# Patient Record
Sex: Female | Born: 1970 | Race: White | Hispanic: No | Marital: Married | Smoking: Never smoker
Health system: Southern US, Community
[De-identification: ages and names within clinical notes are randomized; demographics above are authoritative.]

## PROBLEM LIST (undated history)

## (undated) DIAGNOSIS — Z973 Presence of spectacles and contact lenses: Secondary | ICD-10-CM

## (undated) DIAGNOSIS — R339 Retention of urine, unspecified: Secondary | ICD-10-CM

## (undated) DIAGNOSIS — F418 Other specified anxiety disorders: Secondary | ICD-10-CM

## (undated) DIAGNOSIS — N812 Incomplete uterovaginal prolapse: Secondary | ICD-10-CM

## (undated) DIAGNOSIS — L237 Allergic contact dermatitis due to plants, except food: Secondary | ICD-10-CM

## (undated) DIAGNOSIS — N393 Stress incontinence (female) (male): Secondary | ICD-10-CM

## (undated) DIAGNOSIS — Z8249 Family history of ischemic heart disease and other diseases of the circulatory system: Secondary | ICD-10-CM

## (undated) DIAGNOSIS — R002 Palpitations: Secondary | ICD-10-CM

## (undated) HISTORY — DX: Family history of ischemic heart disease and other diseases of the circulatory system: Z82.49

## (undated) HISTORY — PX: HAND TENDON SURGERY: SHX663

## (undated) HISTORY — PX: URETHRA SURGERY: SHX824

## (undated) HISTORY — DX: Other specified anxiety disorders: F41.8

## (undated) HISTORY — PX: BLADDER SURGERY: SHX569

## (undated) HISTORY — DX: Palpitations: R00.2

---
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
## 1999-03-23 ENCOUNTER — Ambulatory Visit (HOSPITAL_BASED_OUTPATIENT_CLINIC_OR_DEPARTMENT_OTHER): Admission: RE | Admit: 1999-03-23 | Discharge: 1999-03-23 | Payer: Self-pay | Admitting: Orthopedic Surgery

## 1999-03-23 HISTORY — PX: HAND TENDON SURGERY: SHX663

## 1999-08-28 ENCOUNTER — Encounter: Admission: RE | Admit: 1999-08-28 | Discharge: 1999-11-26 | Payer: Self-pay | Admitting: Family Medicine

## 2000-09-12 ENCOUNTER — Other Ambulatory Visit: Admission: RE | Admit: 2000-09-12 | Discharge: 2000-09-12 | Payer: Self-pay | Admitting: *Deleted

## 2002-08-16 ENCOUNTER — Other Ambulatory Visit: Admission: RE | Admit: 2002-08-16 | Discharge: 2002-08-16 | Payer: Self-pay | Admitting: Obstetrics and Gynecology

## 2002-08-27 ENCOUNTER — Encounter: Payer: Self-pay | Admitting: Emergency Medicine

## 2002-08-27 ENCOUNTER — Emergency Department (HOSPITAL_COMMUNITY): Admission: EM | Admit: 2002-08-27 | Discharge: 2002-08-27 | Payer: Self-pay | Admitting: Emergency Medicine

## 2003-01-13 ENCOUNTER — Other Ambulatory Visit: Admission: RE | Admit: 2003-01-13 | Discharge: 2003-01-13 | Payer: Self-pay | Admitting: Obstetrics & Gynecology

## 2003-04-25 ENCOUNTER — Inpatient Hospital Stay (HOSPITAL_COMMUNITY): Admission: AD | Admit: 2003-04-25 | Discharge: 2003-04-25 | Payer: Self-pay | Admitting: Obstetrics and Gynecology

## 2003-08-16 ENCOUNTER — Inpatient Hospital Stay (HOSPITAL_COMMUNITY): Admission: AD | Admit: 2003-08-16 | Discharge: 2003-08-16 | Payer: Self-pay | Admitting: Obstetrics & Gynecology

## 2003-08-17 ENCOUNTER — Inpatient Hospital Stay (HOSPITAL_COMMUNITY): Admission: AD | Admit: 2003-08-17 | Discharge: 2003-08-20 | Payer: Self-pay | Admitting: *Deleted

## 2003-09-12 ENCOUNTER — Encounter: Admission: RE | Admit: 2003-09-12 | Discharge: 2003-10-12 | Payer: Self-pay | Admitting: Obstetrics & Gynecology

## 2005-07-18 ENCOUNTER — Inpatient Hospital Stay (HOSPITAL_COMMUNITY): Admission: AD | Admit: 2005-07-18 | Discharge: 2005-07-18 | Payer: Self-pay | Admitting: Obstetrics & Gynecology

## 2005-07-26 ENCOUNTER — Inpatient Hospital Stay (HOSPITAL_COMMUNITY): Admission: AD | Admit: 2005-07-26 | Discharge: 2005-07-28 | Payer: Self-pay | Admitting: Obstetrics and Gynecology

## 2006-12-04 ENCOUNTER — Encounter: Admission: RE | Admit: 2006-12-04 | Discharge: 2006-12-04 | Payer: Self-pay | Admitting: Obstetrics & Gynecology

## 2007-04-16 DIAGNOSIS — Z8679 Personal history of other diseases of the circulatory system: Secondary | ICD-10-CM

## 2007-04-16 DIAGNOSIS — R002 Palpitations: Secondary | ICD-10-CM

## 2007-04-16 HISTORY — DX: Personal history of other diseases of the circulatory system: Z86.79

## 2007-04-16 HISTORY — DX: Palpitations: R00.2

## 2007-08-23 ENCOUNTER — Emergency Department (HOSPITAL_COMMUNITY): Admission: EM | Admit: 2007-08-23 | Discharge: 2007-08-23 | Payer: Self-pay | Admitting: Emergency Medicine

## 2007-08-26 ENCOUNTER — Encounter: Payer: Self-pay | Admitting: *Deleted

## 2007-08-26 HISTORY — PX: US ECHOCARDIOGRAPHY: HXRAD669

## 2007-09-29 ENCOUNTER — Emergency Department (HOSPITAL_BASED_OUTPATIENT_CLINIC_OR_DEPARTMENT_OTHER): Admission: EM | Admit: 2007-09-29 | Discharge: 2007-09-29 | Payer: Self-pay | Admitting: Emergency Medicine

## 2009-11-02 ENCOUNTER — Ambulatory Visit: Payer: Self-pay | Admitting: Diagnostic Radiology

## 2009-11-02 ENCOUNTER — Emergency Department (HOSPITAL_BASED_OUTPATIENT_CLINIC_OR_DEPARTMENT_OTHER): Admission: EM | Admit: 2009-11-02 | Discharge: 2009-11-02 | Payer: Self-pay | Admitting: Emergency Medicine

## 2009-11-16 ENCOUNTER — Ambulatory Visit: Payer: Self-pay | Admitting: Cardiology

## 2009-11-22 ENCOUNTER — Ambulatory Visit: Payer: Self-pay | Admitting: Cardiology

## 2009-11-24 ENCOUNTER — Encounter: Admission: RE | Admit: 2009-11-24 | Discharge: 2009-11-24 | Payer: Self-pay | Admitting: Family Medicine

## 2010-05-06 ENCOUNTER — Encounter: Payer: Self-pay | Admitting: Obstetrics & Gynecology

## 2010-05-07 ENCOUNTER — Encounter: Payer: Self-pay | Admitting: Obstetrics & Gynecology

## 2010-08-31 NOTE — Op Note (Signed)
[facility]  Patient:    Eileen Fleming                     MRN: [id number] Proc. Date: 03/23/99 Adm. Date:  [id number] Attending:  Marlowe Shores                           Operative Report  PREOPERATIVE DIAGNOSIS:  Right little finger extensor tendon laceration.  POSTOPERATIVE DIAGNOSIS:  Right little finger extensor tendon laceration.  OPERATION:  Repair little finger and extensor tendon laceration.  SURGEON:  Artist Pais. Mina Marble, M.D.  ANESTHESIA:  Bier block.  TOURNIQUET TIME:  21 minutes.  COMPLICATIONS:  No complications.  DRAINS:  No drains.  DESCRIPTION OF PROCEDURE:  The patient was taken to the operating room and after induction of adequate Bier block analgesia, the right upper extremity was prepped and draped in the usual sterile fashion.  Once this was done, the laceration on the dorsal ulnar aspect in the area of the proximal middle phalangeal joint and the little finger on the right was extended proximally for 3 cm and a  large proximal base flap was elevated.  Once the flap was elevated, the extensor mechanism was identified and there was about 75% laceration of the extensor tendon. This was repaired after irrigation and debridement of nonvital tissues, using a  6-0 running Prolene epicutaneous stitch followed by a 4-0 Mersilene horizontal mattress suture.  At th is point in time, the skin was closed with 4-0 nylon and the patient was placed in an extension splint with a sterile dressing of Xeroform, 4x4s, and placed in a Coban wrap.  The patient tolerated the procedure well and  went to the recovery room in stable fashion. DD:  03/23/99 TD:  03/25/99 Job: [id number]

## 2011-01-10 LAB — PREGNANCY, URINE: Preg Test, Ur: NEGATIVE

## 2011-01-10 LAB — URINALYSIS, ROUTINE W REFLEX MICROSCOPIC
Ketones, ur: NEGATIVE
Nitrite: NEGATIVE
Protein, ur: NEGATIVE
Urobilinogen, UA: 0.2

## 2011-01-10 LAB — DIFFERENTIAL
Basophils Absolute: 0
Basophils Relative: 0
Eosinophils Absolute: 0.3
Lymphs Abs: 2
Neutrophils Relative %: 67

## 2011-01-10 LAB — BASIC METABOLIC PANEL
BUN: 19
Chloride: 104
Creatinine, Ser: 0.9
Glucose, Bld: 95

## 2011-01-10 LAB — CBC
MCHC: 34.2
MCV: 91.3
Platelets: 154
RDW: 13.2
WBC: 8.5

## 2011-07-22 ENCOUNTER — Other Ambulatory Visit: Payer: Self-pay | Admitting: Obstetrics & Gynecology

## 2011-07-22 DIAGNOSIS — Z1231 Encounter for screening mammogram for malignant neoplasm of breast: Secondary | ICD-10-CM

## 2011-08-01 ENCOUNTER — Ambulatory Visit: Payer: Self-pay

## 2011-08-08 ENCOUNTER — Ambulatory Visit: Payer: Self-pay

## 2011-08-16 ENCOUNTER — Inpatient Hospital Stay: Admission: RE | Admit: 2011-08-16 | Payer: Self-pay | Source: Ambulatory Visit

## 2011-09-14 ENCOUNTER — Encounter: Payer: Self-pay | Admitting: *Deleted

## 2011-09-26 ENCOUNTER — Ambulatory Visit
Admission: RE | Admit: 2011-09-26 | Discharge: 2011-09-26 | Disposition: A | Discharge status: Home / Self Care | Payer: BC Managed Care – PPO | Source: Ambulatory Visit | Attending: Obstetrics & Gynecology | Admitting: Obstetrics & Gynecology

## 2011-09-26 DIAGNOSIS — Z1231 Encounter for screening mammogram for malignant neoplasm of breast: Secondary | ICD-10-CM

## 2012-05-04 ENCOUNTER — Encounter: Payer: Self-pay | Admitting: Cardiology

## 2012-05-06 ENCOUNTER — Encounter (INDEPENDENT_AMBULATORY_CARE_PROVIDER_SITE_OTHER): Payer: BC Managed Care – PPO

## 2012-05-06 ENCOUNTER — Encounter: Payer: Self-pay | Admitting: Nurse Practitioner

## 2012-05-06 ENCOUNTER — Ambulatory Visit (INDEPENDENT_AMBULATORY_CARE_PROVIDER_SITE_OTHER): Payer: BC Managed Care – PPO | Admitting: Nurse Practitioner

## 2012-05-06 ENCOUNTER — Telehealth: Payer: Self-pay | Admitting: *Deleted

## 2012-05-06 VITALS — BP 98/64 | HR 64 | Ht 63.0 in | Wt 166.2 lb

## 2012-05-06 DIAGNOSIS — R002 Palpitations: Secondary | ICD-10-CM

## 2012-05-06 NOTE — Progress Notes (Signed)
   Eileen Fleming Date of Birth: 1970-05-25 Medical Record #[id number]  History of Present Illness: Ms. Eileen Fleming is seen back today for a work in visit. She is seen for Dr. [place]. She was last seen by him in 2011. She has had past syncopal episode that was felt to be vasovagal. She has no known CAD. Has had palpitations in the past with a remote negative Holter and echocardiogram.   She comes in today. She is here alone. She has basically done well up until last Thursday/Friday. She then started having recurrent palpitations. There is no pattern. She feels nauseated and queasy with the palpitations. Not lightheaded. No chest pain. No syncope. Has not been using extra caffeine. Has even stopped her one cup of coffee per day with no change. Describes this as "butterflies" in her chest. Different from what she felt several years ago. Went to her PCP at [facility]. EKG and labs there were all normal.   Current Outpatient Prescriptions on File Prior to Visit  Medication Sig Dispense Refill  . FLUoxetine (PROZAC) 20 MG tablet Take 20 mg by mouth daily.        Allergies  Allergen Reactions  . Morphine And Related     Itching    Past Medical History  Diagnosis Date  . Palpitations 2009    with normal Holter monitor & negative echo   . Depression with anxiety   . FHx: hypertension     Past Surgical History  Procedure Date  . Hand tendon surgery     repair to right hand  . Bladder surgery     as a child  . US echocardiography 08-26-2007    EF 55-60%    History  Smoking status  . Never Smoker   Smokeless tobacco  . Not on file    History  Alcohol Use No    Family History  Problem Relation Age of Onset  . Hypertension Mother     Review of Systems: The review of systems is per the HPI.  All other systems were reviewed and are negative.  Physical Exam: BP 98/64  Pulse 64  Ht 5\' 3"  (1.6 m)  Wt 166 lb 4 oz (75.411 kg)  BMI 29.45 kg/m2  LMP 05/05/2012 Patient is very  pleasant and in no acute distress. Skin is warm and dry. Color is normal.  HEENT is unremarkable. Normocephalic/atraumatic. PERRL. Sclera are nonicteric. Neck is supple. No masses. No JVD. Lungs are clear. Cardiac exam shows a regular rate and rhythm. Abdomen is soft. Extremities are without edema. Gait and ROM are intact. No gross neurologic deficits noted.   LABORATORY DATA:  Lab Results  Component Value Date   WBC 8.5 09/29/2007   HGB 12.6 09/29/2007   HCT 36.9 09/29/2007   PLT 154 09/29/2007   GLUCOSE 95 09/29/2007   NA 138 09/29/2007   K 4.2 09/29/2007   CL 104 09/29/2007   CREATININE .9 09/29/2007   BUN 19 09/29/2007   CO2 31 09/29/2007     Assessment / Plan:  1. Palpitations - will place a 24 hour Holter. I have left her on her current regimen. Do not have a lot of BP to work with so this will be hard to give her medicines if needed. I have asked her to liberalize her salt use in the interim. Further disposition to follow.   Patient is agreeable to this plan and will call if any problems develop in the interim.

## 2012-05-06 NOTE — Patient Instructions (Addendum)
We are going to place a 24 hour Holter today  Liberalize your salt  For now, no change in your medicines but we may add medicine based on the monitor results  We will call you with the monitor results  Call the [facility] office at [phone] if you have any questions, problems or concerns.

## 2012-05-06 NOTE — Telephone Encounter (Signed)
24 hr holter moniter placed on Pt 05/06/12 TK

## 2012-05-11 ENCOUNTER — Telehealth: Payer: Self-pay | Admitting: Cardiology

## 2012-05-11 NOTE — Telephone Encounter (Signed)
Pt calling for results of heart monitor °

## 2012-05-11 NOTE — Telephone Encounter (Signed)
Patient called was told Dr.Jordan has not reviewed monitor.Dr.Jordan out of office today will show him monitor 05/12/12 and call her back.

## 2012-05-12 NOTE — Telephone Encounter (Signed)
Patient called was told Dr.Jordan reviewed 24 hr holter monitor which revealed rare pvc's and pac's.Advised to follow up with PCP.

## 2012-05-21 ENCOUNTER — Encounter: Payer: Self-pay | Admitting: Nurse Practitioner

## 2012-08-21 ENCOUNTER — Other Ambulatory Visit: Payer: Self-pay

## 2012-08-21 DIAGNOSIS — Z1231 Encounter for screening mammogram for malignant neoplasm of breast: Secondary | ICD-10-CM

## 2012-09-28 ENCOUNTER — Ambulatory Visit: Payer: BC Managed Care – PPO

## 2012-10-23 ENCOUNTER — Ambulatory Visit
Admission: RE | Admit: 2012-10-23 | Discharge: 2012-10-23 | Disposition: A | Discharge status: Home / Self Care | Payer: BC Managed Care – PPO | Source: Ambulatory Visit

## 2012-10-23 DIAGNOSIS — Z1231 Encounter for screening mammogram for malignant neoplasm of breast: Secondary | ICD-10-CM

## 2013-10-26 ENCOUNTER — Other Ambulatory Visit: Payer: Self-pay

## 2013-10-26 DIAGNOSIS — Z1231 Encounter for screening mammogram for malignant neoplasm of breast: Secondary | ICD-10-CM

## 2013-11-04 ENCOUNTER — Ambulatory Visit: Payer: BC Managed Care – PPO

## 2013-12-14 ENCOUNTER — Ambulatory Visit
Admission: RE | Admit: 2013-12-14 | Discharge: 2013-12-14 | Disposition: A | Discharge status: Home / Self Care | Payer: BC Managed Care – PPO | Source: Ambulatory Visit

## 2013-12-14 ENCOUNTER — Encounter (INDEPENDENT_AMBULATORY_CARE_PROVIDER_SITE_OTHER): Payer: Self-pay

## 2013-12-14 DIAGNOSIS — Z1231 Encounter for screening mammogram for malignant neoplasm of breast: Secondary | ICD-10-CM

## 2017-04-23 ENCOUNTER — Other Ambulatory Visit: Payer: Self-pay | Admitting: Obstetrics & Gynecology

## 2017-04-23 DIAGNOSIS — Z1231 Encounter for screening mammogram for malignant neoplasm of breast: Secondary | ICD-10-CM

## 2017-05-15 ENCOUNTER — Ambulatory Visit
Admission: RE | Admit: 2017-05-15 | Discharge: 2017-05-15 | Disposition: A | Discharge status: Home / Self Care | Payer: BLUE CROSS/BLUE SHIELD | Source: Ambulatory Visit | Attending: Obstetrics & Gynecology | Admitting: Obstetrics & Gynecology

## 2017-05-15 DIAGNOSIS — Z1231 Encounter for screening mammogram for malignant neoplasm of breast: Secondary | ICD-10-CM

## 2017-06-11 ENCOUNTER — Ambulatory Visit (INDEPENDENT_AMBULATORY_CARE_PROVIDER_SITE_OTHER): Payer: BLUE CROSS/BLUE SHIELD | Admitting: Obstetrics & Gynecology

## 2017-06-11 ENCOUNTER — Encounter: Payer: Self-pay | Admitting: Obstetrics & Gynecology

## 2017-06-11 VITALS — BP 96/64 | Ht 63.0 in | Wt 167.0 lb

## 2017-06-11 DIAGNOSIS — Z01419 Encounter for gynecological examination (general) (routine) without abnormal findings: Secondary | ICD-10-CM | POA: Diagnosis not present

## 2017-06-11 DIAGNOSIS — Z3009 Encounter for other general counseling and advice on contraception: Secondary | ICD-10-CM | POA: Diagnosis not present

## 2017-06-11 NOTE — Progress Notes (Signed)
Eileen Fleming 1970/11/26 [id number]   History:    47 y.o. G2P2L2 Married.  Works at the [employer].  Daughter turning 11 and son 23 this spring.  RP:  Established patient presenting for annual gyn exam   HPI: Menses regular every months with light flow times 3 days.  No pelvic pain.  Normal vaginal secretions.  No pain with intercourse.  Using natural method of contraception, declines alternatives.  Urine and bowel movements normal.  Breasts normal.  Health labs with family physician.  Body mass index 29.58.  Past medical history,surgical history, family history and social history were all reviewed and documented in the EPIC chart.  Gynecologic History Patient's last menstrual period was 05/31/2017. Contraception: rhythm method Last Pap: 2018. Results were: normal per patient, will obtain records at [facility]. Last mammogram: January 2019. Results were: Negative Bone Density: Never Colonoscopy: Never  Obstetric History OB History  Gravida Para Term Preterm AB Living  2 2       2   SAB TAB Ectopic Multiple Live Births               # Outcome Date GA Lbr Len/2nd Weight Sex Delivery Anes PTL Lv  2 Para     F Vag-Spont     1 Para     M Vag-Spont          ROS: A ROS was performed and pertinent positives and negatives are included in the history.  GENERAL: No fevers or chills. HEENT: No change in vision, no earache, sore throat or sinus congestion. NECK: No pain or stiffness. CARDIOVASCULAR: No chest pain or pressure. No palpitations. PULMONARY: No shortness of breath, cough or wheeze. GASTROINTESTINAL: No abdominal pain, nausea, vomiting or diarrhea, melena or bright red blood per rectum. GENITOURINARY: No urinary frequency, urgency, hesitancy or dysuria. MUSCULOSKELETAL: No joint or muscle pain, no back pain, no recent trauma. DERMATOLOGIC: No rash, no itching, no lesions. ENDOCRINE: No polyuria, polydipsia, no heat or cold intolerance. No recent change in weight. HEMATOLOGICAL:  No anemia or easy bruising or bleeding. NEUROLOGIC: No headache, seizures, numbness, tingling or weakness. PSYCHIATRIC: No depression, no loss of interest in normal activity or change in sleep pattern.     Exam:   BP 96/64   Ht 5\' 3"  (1.6 m)   Wt 167 lb (75.8 kg)   LMP 05/31/2017 Comment: no birth control   BMI 29.58 kg/m   Body mass index is 29.58 kg/m.  General appearance : Well developed well nourished female. No acute distress HEENT: Eyes: no retinal hemorrhage or exudates,  Neck supple, trachea midline, no carotid bruits, no thyroidmegaly Lungs: Clear to auscultation, no rhonchi or wheezes, or rib retractions  Heart: Regular rate and rhythm, no murmurs or gallops Breast:Examined in sitting and supine position were symmetrical in appearance, no palpable masses or tenderness,  no skin retraction, no nipple inversion, no nipple discharge, no skin discoloration, no axillary or supraclavicular lymphadenopathy Abdomen: no palpable masses or tenderness, no rebound or guarding Extremities: no edema or skin discoloration or tenderness  Pelvic: Vulva: Normal             Vagina: No gross lesions or discharge  Cervix: No gross lesions or discharge  Uterus anteverted, normal size, shape and consistency, non-tender and mobile  Adnexa  Without masses or tenderness  Anus: Normal   Assessment/Plan:  47 y.o. female for annual exam   1. Well female exam with routine gynecological exam Normal gynecologic exam.  Pap test negative  in 2018.  Will repeat Pap next year.  Breast exam normal.  Screening mammogram negative January 2019.  Health labs with family physician.  2. Encounter for other general counseling or advice on contraception Uses rhythm method for contraception.  Declines any other contraceptive method.  Eileen DelMarie-Lyne Royal Beirne MD, 8:23 AM 06/11/2017

## 2017-06-12 ENCOUNTER — Encounter: Payer: Self-pay | Admitting: Obstetrics & Gynecology

## 2017-06-12 NOTE — Patient Instructions (Signed)
1. Well female exam with routine gynecological exam Normal gynecologic exam.  Pap test negative in 2018.  Will repeat Pap next year.  Breast exam normal.  Screening mammogram negative January 2019.  Health labs with family physician.  2. Encounter for other general counseling or advice on contraception Uses rhythm method for contraception.  Declines any other contraceptive method.  Eileen Fleming, it was a pleasure seeing you today!  Health Maintenance, Female Adopting a healthy lifestyle and getting preventive care can go a long way to promote health and wellness. Talk with your health care provider about what schedule of regular examinations is right for you. This is a good chance for you to check in with your provider about disease prevention and staying healthy. In between checkups, there are plenty of things you can do on your own. Experts have done a lot of research about which lifestyle changes and preventive measures are most likely to keep you healthy. Ask your health care provider for more information. Weight and diet Eat a healthy diet  Be sure to include plenty of vegetables, fruits, low-fat dairy products, and lean protein.  Do not eat a lot of foods high in solid fats, added sugars, or salt.  Get regular exercise. This is one of the most important things you can do for your health. ? Most adults should exercise for at least 150 minutes each week. The exercise should increase your heart rate and make you sweat (moderate-intensity exercise). ? Most adults should also do strengthening exercises at least twice a week. This is in addition to the moderate-intensity exercise.  Maintain a healthy weight  Body mass index (BMI) is a measurement that can be used to identify possible weight problems. It estimates body fat based on height and weight. Your health care provider can help determine your BMI and help you achieve or maintain a healthy weight.  For females 68 years of age and older: ? A  BMI below 18.5 is considered underweight. ? A BMI of 18.5 to 24.9 is normal. ? A BMI of 25 to 29.9 is considered overweight. ? A BMI of 30 and above is considered obese.  Watch levels of cholesterol and blood lipids  You should start having your blood tested for lipids and cholesterol at 46 years of age, then have this test every 5 years.  You may need to have your cholesterol levels checked more often if: ? Your lipid or cholesterol levels are high. ? You are older than 46 years of age. ? You are at high risk for heart disease.  Cancer screening Lung Cancer  Lung cancer screening is recommended for adults 40-93 years old who are at high risk for lung cancer because of a history of smoking.  A yearly low-dose CT scan of the lungs is recommended for people who: ? Currently smoke. ? Have quit within the past 15 years. ? Have at least a 30-pack-year history of smoking. A pack year is smoking an average of one pack of cigarettes a day for 1 year.  Yearly screening should continue until it has been 15 years since you quit.  Yearly screening should stop if you develop a health problem that would prevent you from having lung cancer treatment.  Breast Cancer  Practice breast self-awareness. This means understanding how your breasts normally appear and feel.  It also means doing regular breast self-exams. Let your health care provider know about any changes, no matter how small.  If you are in your 37s or  11s, you should have a clinical breast exam (CBE) by a health care provider every 1-3 years as part of a regular health exam.  If you are 53 or older, have a CBE every year. Also consider having a breast X-ray (mammogram) every year.  If you have a family history of breast cancer, talk to your health care provider about genetic screening.  If you are at high risk for breast cancer, talk to your health care provider about having an MRI and a mammogram every year.  Breast cancer gene  (BRCA) assessment is recommended for women who have family members with BRCA-related cancers. BRCA-related cancers include: ? Breast. ? Ovarian. ? Tubal. ? Peritoneal cancers.  Results of the assessment will determine the need for genetic counseling and BRCA1 and BRCA2 testing.  Cervical Cancer Your health care provider may recommend that you be screened regularly for cancer of the pelvic organs (ovaries, uterus, and vagina). This screening involves a pelvic examination, including checking for microscopic changes to the surface of your cervix (Pap test). You may be encouraged to have this screening done every 3 years, beginning at age 4.  For women ages 28-65, health care providers may recommend pelvic exams and Pap testing every 3 years, or they may recommend the Pap and pelvic exam, combined with testing for human papilloma virus (HPV), every 5 years. Some types of HPV increase your risk of cervical cancer. Testing for HPV may also be done on women of any age with unclear Pap test results.  Other health care providers may not recommend any screening for nonpregnant women who are considered low risk for pelvic cancer and who do not have symptoms. Ask your health care provider if a screening pelvic exam is right for you.  If you have had past treatment for cervical cancer or a condition that could lead to cancer, you need Pap tests and screening for cancer for at least 20 years after your treatment. If Pap tests have been discontinued, your risk factors (such as having a new sexual partner) need to be reassessed to determine if screening should resume. Some women have medical problems that increase the chance of getting cervical cancer. In these cases, your health care provider may recommend more frequent screening and Pap tests.  Colorectal Cancer  This type of cancer can be detected and often prevented.  Routine colorectal cancer screening usually begins at 46 years of age and continues  through 46 years of age.  Your health care provider may recommend screening at an earlier age if you have risk factors for colon cancer.  Your health care provider may also recommend using home test kits to check for hidden blood in the stool.  A small camera at the end of a tube can be used to examine your colon directly (sigmoidoscopy or colonoscopy). This is done to check for the earliest forms of colorectal cancer.  Routine screening usually begins at age 42.  Direct examination of the colon should be repeated every 5-10 years through 46 years of age. However, you may need to be screened more often if early forms of precancerous polyps or small growths are found.  Skin Cancer  Check your skin from head to toe regularly.  Tell your health care provider about any new moles or changes in moles, especially if there is a change in a mole's shape or color.  Also tell your health care provider if you have a mole that is larger than the size of a pencil  eraser.  Always use sunscreen. Apply sunscreen liberally and repeatedly throughout the day.  Protect yourself by wearing long sleeves, pants, a wide-brimmed hat, and sunglasses whenever you are outside.  Heart disease, diabetes, and high blood pressure  High blood pressure causes heart disease and increases the risk of stroke. High blood pressure is more likely to develop in: ? People who have blood pressure in the high end of the normal range (130-139/85-89 mm Hg). ? People who are overweight or obese. ? People who are African American.  If you are 41-59 years of age, have your blood pressure checked every 3-5 years. If you are 46 years of age or older, have your blood pressure checked every year. You should have your blood pressure measured twice-once when you are at a hospital or clinic, and once when you are not at a hospital or clinic. Record the average of the two measurements. To check your blood pressure when you are not at a  hospital or clinic, you can use: ? An automated blood pressure machine at a pharmacy. ? A home blood pressure monitor.  If you are between 3 years and 66 years old, ask your health care provider if you should take aspirin to prevent strokes.  Have regular diabetes screenings. This involves taking a blood sample to check your fasting blood sugar level. ? If you are at a normal weight and have a low risk for diabetes, have this test once every three years after 46 years of age. ? If you are overweight and have a high risk for diabetes, consider being tested at a younger age or more often. Preventing infection Hepatitis B  If you have a higher risk for hepatitis B, you should be screened for this virus. You are considered at high risk for hepatitis B if: ? You were born in a country where hepatitis B is common. Ask your health care provider which countries are considered high risk. ? Your parents were born in a high-risk country, and you have not been immunized against hepatitis B (hepatitis B vaccine). ? You have HIV or AIDS. ? You use needles to inject street drugs. ? You live with someone who has hepatitis B. ? You have had sex with someone who has hepatitis B. ? You get hemodialysis treatment. ? You take certain medicines for conditions, including cancer, organ transplantation, and autoimmune conditions.  Hepatitis C  Blood testing is recommended for: ? Everyone born from [birth year] through 1964. ? Anyone with known risk factors for hepatitis C.  Sexually transmitted infections (STIs)  You should be screened for sexually transmitted infections (STIs) including gonorrhea and chlamydia if: ? You are sexually active and are younger than 46 years of age. ? You are older than 46 years of age and your health care provider tells you that you are at risk for this type of infection. ? Your sexual activity has changed since you were last screened and you are at an increased risk for chlamydia or  gonorrhea. Ask your health care provider if you are at risk.  If you do not have HIV, but are at risk, it may be recommended that you take a prescription medicine daily to prevent HIV infection. This is called pre-exposure prophylaxis (PrEP). You are considered at risk if: ? You are sexually active and do not regularly use condoms or know the HIV status of your partner(s). ? You take drugs by injection. ? You are sexually active with a partner who has HIV.  Talk with your health care provider about whether you are at high risk of being infected with HIV. If you choose to begin PrEP, you should first be tested for HIV. You should then be tested every 3 months for as long as you are taking PrEP. Pregnancy  If you are premenopausal and you may become pregnant, ask your health care provider about preconception counseling.  If you may become pregnant, take 400 to 800 micrograms (mcg) of folic acid every day.  If you want to prevent pregnancy, talk to your health care provider about birth control (contraception). Osteoporosis and menopause  Osteoporosis is a disease in which the bones lose minerals and strength with aging. This can result in serious bone fractures. Your risk for osteoporosis can be identified using a bone density scan.  If you are 68 years of age or older, or if you are at risk for osteoporosis and fractures, ask your health care provider if you should be screened.  Ask your health care provider whether you should take a calcium or vitamin D supplement to lower your risk for osteoporosis.  Menopause may have certain physical symptoms and risks.  Hormone replacement therapy may reduce some of these symptoms and risks. Talk to your health care provider about whether hormone replacement therapy is right for you. Follow these instructions at home:  Schedule regular health, dental, and eye exams.  Stay current with your immunizations.  Do not use any tobacco products including  cigarettes, chewing tobacco, or electronic cigarettes.  If you are pregnant, do not drink alcohol.  If you are breastfeeding, limit how much and how often you drink alcohol.  Limit alcohol intake to no more than 1 drink per day for nonpregnant women. One drink equals 12 ounces of beer, 5 ounces of wine, or 1 ounces of hard liquor.  Do not use street drugs.  Do not share needles.  Ask your health care provider for help if you need support or information about quitting drugs.  Tell your health care provider if you often feel depressed.  Tell your health care provider if you have ever been abused or do not feel safe at home. This information is not intended to replace advice given to you by your health care provider. Make sure you discuss any questions you have with your health care provider. Document Released: 10/15/2010 Document Revised: 09/07/2015 Document Reviewed: 01/03/2015 Elsevier Interactive Patient Education  [employer].

## 2018-06-11 ENCOUNTER — Ambulatory Visit (INDEPENDENT_AMBULATORY_CARE_PROVIDER_SITE_OTHER): Payer: BLUE CROSS/BLUE SHIELD | Admitting: Obstetrics & Gynecology

## 2018-06-11 ENCOUNTER — Other Ambulatory Visit: Payer: Self-pay | Admitting: Obstetrics & Gynecology

## 2018-06-11 ENCOUNTER — Encounter: Payer: Self-pay | Admitting: Obstetrics & Gynecology

## 2018-06-11 VITALS — BP 108/70 | Ht 62.5 in | Wt 164.2 lb

## 2018-06-11 DIAGNOSIS — Z01419 Encounter for gynecological examination (general) (routine) without abnormal findings: Secondary | ICD-10-CM

## 2018-06-11 DIAGNOSIS — E663 Overweight: Secondary | ICD-10-CM

## 2018-06-11 DIAGNOSIS — Z3009 Encounter for other general counseling and advice on contraception: Secondary | ICD-10-CM | POA: Diagnosis not present

## 2018-06-11 DIAGNOSIS — Z1231 Encounter for screening mammogram for malignant neoplasm of breast: Secondary | ICD-10-CM

## 2018-06-11 NOTE — Progress Notes (Signed)
Eileen Fleming July 04, 1970 [id number]   History:    48 y.o. G2P2L2 Married.  Daughter 32 yo, son 5 yo  RP:  Established patient presenting for annual gyn exam   HPI: Menstrual periods every month with normal flow.  No breakthrough bleeding.  No pelvic pain.  No pain with intercourse.  Using the natural rhythm method and declines other methods of contraception.  Breast normal.  Urine and bowel movements normal.  Body mass index stable at 29.55.  Started increasing fitness activities 3 weeks ago.  Health labs with family physician.  Past medical history,surgical history, family history and social history were all reviewed and documented in the EPIC chart.  Gynecologic History Patient's last menstrual period was 05/25/2018. Contraception: rhythm method, declines other methods Last Pap: 12/2014. Results were: Negative/HPV HR neg Last mammogram: 04/2017. Results were: Negative.  Scheduled next week Bone Density: Never Colonoscopy: 10/2017  Obstetric History OB History  Gravida Para Term Preterm AB Living  2 2       2   SAB TAB Ectopic Multiple Live Births               # Outcome Date GA Lbr Len/2nd Weight Sex Delivery Anes PTL Lv  2 Para     F Vag-Spont     1 Para     M Vag-Spont        ROS: A ROS was performed and pertinent positives and negatives are included in the history.  GENERAL: No fevers or chills. HEENT: No change in vision, no earache, sore throat or sinus congestion. NECK: No pain or stiffness. CARDIOVASCULAR: No chest pain or pressure. No palpitations. PULMONARY: No shortness of breath, cough or wheeze. GASTROINTESTINAL: No abdominal pain, nausea, vomiting or diarrhea, melena or bright red blood per rectum. GENITOURINARY: No urinary frequency, urgency, hesitancy or dysuria. MUSCULOSKELETAL: No joint or muscle pain, no back pain, no recent trauma. DERMATOLOGIC: No rash, no itching, no lesions. ENDOCRINE: No polyuria, polydipsia, no heat or cold intolerance. No recent change in  weight. HEMATOLOGICAL: No anemia or easy bruising or bleeding. NEUROLOGIC: No headache, seizures, numbness, tingling or weakness. PSYCHIATRIC: No depression, no loss of interest in normal activity or change in sleep pattern.     Exam:   BP 108/70   Ht 5' 2.5" (1.588 m)   Wt 164 lb 3.2 oz (74.5 kg)   LMP 05/25/2018   BMI 29.55 kg/m   Body mass index is 29.55 kg/m.  General appearance : Well developed well nourished female. No acute distress HEENT: Eyes: no retinal hemorrhage or exudates,  Neck supple, trachea midline, no carotid bruits, no thyroidmegaly Lungs: Clear to auscultation, no rhonchi or wheezes, or rib retractions  Heart: Regular rate and rhythm, no murmurs or gallops Breast:Examined in sitting and supine position were symmetrical in appearance, no palpable masses or tenderness,  no skin retraction, no nipple inversion, no nipple discharge, no skin discoloration, no axillary or supraclavicular lymphadenopathy Abdomen: no palpable masses or tenderness, no rebound or guarding Extremities: no edema or skin discoloration or tenderness  Pelvic: Vulva: Normal             Vagina: No gross lesions or discharge  Cervix: No gross lesions or discharge.  Pap reflex done.  Uterus  AV, normal size, shape and consistency, non-tender and mobile  Adnexa  Without masses or tenderness  Anus: Normal   Assessment/Plan:  48 y.o. female for annual exam   1. Encounter for routine gynecological examination with Papanicolaou smear of  cervix Normal gynecologic exam.  Pap reflex done today.  Breast exam normal.  Screening mammogram scheduled for next week.  Health labs with family physician.  2. Encounter for other general counseling or advice on contraception Using natural rhythm method for contraception.  Declines other types of contraception.  3. Overweight (BMI 25.0-29.9) Started exercising more for the last 3 weeks, recommend aerobic physical activities 5 times a week and weightlifting  every 2 days.  Mild decrease in calories/carbs recommended.  [name] MD, 2:42 PM 06/11/2018

## 2018-06-11 NOTE — Patient Instructions (Signed)
1. Encounter for routine gynecological examination with Papanicolaou smear of cervix Normal gynecologic exam.  Pap reflex done today.  Breast exam normal.  Screening mammogram scheduled for next week.  Health labs with family physician.  2. Encounter for other general counseling or advice on contraception Using natural rhythm method for contraception.  Declines other types of contraception.  3. Overweight (BMI 25.0-29.9) Started exercising more for the last 3 weeks, recommend aerobic physical activities 5 times a week and weightlifting every 2 days.  Mild decrease in calories/carbs recommended.  Eileen Fleming, it was a pleasure seeing you today!  I will inform you on your results as soon as they are available.

## 2018-06-11 NOTE — Addendum Note (Signed)
Addended by: Becky Sax on: 06/11/2018 03:12 PM   Modules accepted: Orders

## 2018-06-13 LAB — PAP IG W/ RFLX HPV ASCU

## 2018-06-15 ENCOUNTER — Ambulatory Visit
Admission: RE | Admit: 2018-06-15 | Discharge: 2018-06-15 | Disposition: A | Discharge status: Home / Self Care | Payer: BLUE CROSS/BLUE SHIELD | Source: Ambulatory Visit | Attending: Obstetrics & Gynecology | Admitting: Obstetrics & Gynecology

## 2018-06-15 DIAGNOSIS — Z1231 Encounter for screening mammogram for malignant neoplasm of breast: Secondary | ICD-10-CM

## 2019-02-22 ENCOUNTER — Other Ambulatory Visit: Payer: Self-pay

## 2019-02-22 DIAGNOSIS — Z20822 Contact with and (suspected) exposure to covid-19: Secondary | ICD-10-CM

## 2019-02-24 LAB — NOVEL CORONAVIRUS, NAA: SARS-CoV-2, NAA: NOT DETECTED

## 2019-05-19 ENCOUNTER — Other Ambulatory Visit: Payer: Self-pay | Admitting: Obstetrics & Gynecology

## 2019-05-19 DIAGNOSIS — Z1231 Encounter for screening mammogram for malignant neoplasm of breast: Secondary | ICD-10-CM

## 2019-06-24 ENCOUNTER — Ambulatory Visit
Admission: RE | Admit: 2019-06-24 | Discharge: 2019-06-24 | Disposition: A | Discharge status: Home / Self Care | Payer: BC Managed Care – PPO | Source: Ambulatory Visit | Attending: Obstetrics & Gynecology | Admitting: Obstetrics & Gynecology

## 2019-06-24 ENCOUNTER — Other Ambulatory Visit: Payer: Self-pay

## 2019-06-24 DIAGNOSIS — Z1231 Encounter for screening mammogram for malignant neoplasm of breast: Secondary | ICD-10-CM

## 2019-07-12 ENCOUNTER — Encounter: Payer: BLUE CROSS/BLUE SHIELD | Admitting: Obstetrics & Gynecology

## 2019-08-12 ENCOUNTER — Other Ambulatory Visit: Payer: Self-pay

## 2019-08-13 ENCOUNTER — Encounter: Payer: Self-pay | Admitting: Obstetrics & Gynecology

## 2019-10-20 ENCOUNTER — Ambulatory Visit (INDEPENDENT_AMBULATORY_CARE_PROVIDER_SITE_OTHER): Payer: BC Managed Care – PPO | Admitting: Obstetrics & Gynecology

## 2019-10-20 ENCOUNTER — Other Ambulatory Visit: Payer: Self-pay

## 2019-10-20 ENCOUNTER — Encounter: Payer: Self-pay | Admitting: Obstetrics & Gynecology

## 2019-10-20 VITALS — BP 120/78 | Ht 62.0 in | Wt 165.0 lb

## 2019-10-20 DIAGNOSIS — Z01419 Encounter for gynecological examination (general) (routine) without abnormal findings: Secondary | ICD-10-CM

## 2019-10-20 DIAGNOSIS — Z3009 Encounter for other general counseling and advice on contraception: Secondary | ICD-10-CM | POA: Diagnosis not present

## 2019-10-20 NOTE — Patient Instructions (Signed)
1. Well female exam with routine gynecological exam Normal gynecologic exam.  Pap test negative in 2020, no indication to repeat this year.  Breast exam normal.  Screening mammogram March 2021 was negative.  Body mass index 30.18, patient is in good fitness going to the gym regularly and building her muscle mass.  Health labs with family physician.  2. Encounter for other general counseling or advice on contraception Using the natural rhythm method load.  Declines contraception.  Eileen Fleming, it was a pleasure seeing you today!

## 2019-10-20 NOTE — Progress Notes (Signed)
Eileen Fleming Nov 21, 1970 [id number]   History:    49 y.o. G2P2L2 Married.  Daughter 77 yo, son 42 yo  RP:  Established patient presenting for annual gyn exam   HPI: Menstrual periods every month with normal flow.  No breakthrough bleeding.  No pelvic pain.  No pain with intercourse.  Using the natural rhythm method and declines other methods of contraception.  Breast normal.  Urine and bowel movements normal.  Body mass index 30.18.  Good muscle mass, going to the gym regularly.  Health labs with family physician.   Past medical history,surgical history, family history and social history were all reviewed and documented in the EPIC chart.  Gynecologic History No LMP recorded.  Obstetric History OB History  Gravida Para Term Preterm AB Living  2 2       2   SAB TAB Ectopic Multiple Live Births               # Outcome Date GA Lbr Len/2nd Weight Sex Delivery Anes PTL Lv  2 Para     F Vag-Spont     1 Para     M Vag-Spont        ROS: A ROS was performed and pertinent positives and negatives are included in the history.  GENERAL: No fevers or chills. HEENT: No change in vision, no earache, sore throat or sinus congestion. NECK: No pain or stiffness. CARDIOVASCULAR: No chest pain or pressure. No palpitations. PULMONARY: No shortness of breath, cough or wheeze. GASTROINTESTINAL: No abdominal pain, nausea, vomiting or diarrhea, melena or bright red blood per rectum. GENITOURINARY: No urinary frequency, urgency, hesitancy or dysuria. MUSCULOSKELETAL: No joint or muscle pain, no back pain, no recent trauma. DERMATOLOGIC: No rash, no itching, no lesions. ENDOCRINE: No polyuria, polydipsia, no heat or cold intolerance. No recent change in weight. HEMATOLOGICAL: No anemia or easy bruising or bleeding. NEUROLOGIC: No headache, seizures, numbness, tingling or weakness. PSYCHIATRIC: No depression, no loss of interest in normal activity or change in sleep pattern.     Exam:   BP 120/78 (BP  Location: Right Arm, Patient Position: Sitting, Cuff Size: Normal)   Ht 5\' 2"  (1.575 m)   Wt 165 lb (74.8 kg)   BMI 30.18 kg/m   Body mass index is 30.18 kg/m.  General appearance : Well developed well nourished female. No acute distress HEENT: Eyes: no retinal hemorrhage or exudates,  Neck supple, trachea midline, no carotid bruits, no thyroidmegaly Lungs: Clear to auscultation, no rhonchi or wheezes, or rib retractions  Heart: Regular rate and rhythm, no murmurs or gallops Breast:Examined in sitting and supine position were symmetrical in appearance, no palpable masses or tenderness,  no skin retraction, no nipple inversion, no nipple discharge, no skin discoloration, no axillary or supraclavicular lymphadenopathy Abdomen: no palpable masses or tenderness, no rebound or guarding Extremities: no edema or skin discoloration or tenderness  Pelvic: Vulva: Normal             Vagina: No gross lesions or discharge  Cervix: No gross lesions or discharge  Uterus  RV, normal size, shape and consistency, non-tender and mobile  Adnexa  Without masses or tenderness  Anus: Normal   Assessment/Plan:  49 y.o. female for annual exam   1. Well female exam with routine gynecological exam Normal gynecologic exam.  Pap test negative in 2020, no indication to repeat this year.  Breast exam normal.  Screening mammogram March 2021 was negative.  Body mass index 30.18, patient is in  good fitness going to the gym regularly and building her muscle mass.  Health labs with family physician.  2. Encounter for other general counseling or advice on contraception Using the natural rhythm method load.  Declines contraception.  [name] MD, 3:17 PM 10/20/2019

## 2020-05-25 ENCOUNTER — Other Ambulatory Visit: Payer: Self-pay | Admitting: Obstetrics & Gynecology

## 2020-05-25 DIAGNOSIS — Z1231 Encounter for screening mammogram for malignant neoplasm of breast: Secondary | ICD-10-CM

## 2020-07-14 ENCOUNTER — Ambulatory Visit: Payer: BC Managed Care – PPO

## 2020-09-13 ENCOUNTER — Encounter: Payer: Self-pay | Admitting: Obstetrics & Gynecology

## 2020-09-18 ENCOUNTER — Encounter: Payer: Self-pay | Admitting: Anesthesiology

## 2020-12-08 ENCOUNTER — Ambulatory Visit: Payer: BC Managed Care – PPO | Admitting: Obstetrics & Gynecology

## 2021-02-16 ENCOUNTER — Ambulatory Visit: Payer: BC Managed Care – PPO | Admitting: Obstetrics & Gynecology

## 2021-02-27 ENCOUNTER — Ambulatory Visit: Payer: BC Managed Care – PPO | Admitting: Obstetrics & Gynecology

## 2021-03-12 ENCOUNTER — Ambulatory Visit: Payer: BC Managed Care – PPO | Admitting: Obstetrics & Gynecology

## 2021-05-10 ENCOUNTER — Other Ambulatory Visit: Payer: Self-pay

## 2021-05-10 ENCOUNTER — Other Ambulatory Visit (HOSPITAL_COMMUNITY)
Admission: RE | Admit: 2021-05-10 | Discharge: 2021-05-10 | Disposition: A | Discharge status: Home / Self Care | Payer: BC Managed Care – PPO | Source: Ambulatory Visit | Attending: Obstetrics & Gynecology | Admitting: Obstetrics & Gynecology

## 2021-05-10 ENCOUNTER — Encounter: Payer: Self-pay | Admitting: Obstetrics & Gynecology

## 2021-05-10 ENCOUNTER — Ambulatory Visit (INDEPENDENT_AMBULATORY_CARE_PROVIDER_SITE_OTHER): Payer: BC Managed Care – PPO | Admitting: Obstetrics & Gynecology

## 2021-05-10 VITALS — BP 114/72 | HR 80 | Resp 16 | Ht 62.5 in | Wt 163.0 lb

## 2021-05-10 DIAGNOSIS — Z01419 Encounter for gynecological examination (general) (routine) without abnormal findings: Secondary | ICD-10-CM | POA: Diagnosis present

## 2021-05-10 DIAGNOSIS — Z3009 Encounter for other general counseling and advice on contraception: Secondary | ICD-10-CM

## 2021-05-10 NOTE — Progress Notes (Signed)
Eileen Fleming May 26, 1970 [id number]   History:    51 y.o. . G2P2L2 Married.  Daughter 37+ yo, son 25+ yo   RP:  Established patient presenting for annual gyn exam    HPI: Menstrual periods every month with normal flow.  No breakthrough bleeding.  No pelvic pain.  No pain with intercourse.  Using the natural rhythm method and declines other methods of contraception.  Pap 06/11/2018 Neg.  Pap reflex today.  Breast normal.  Mammo Neg 09/2020.  Urine and bowel movements normal.  Body mass index 29.34.  Good muscle mass, will start back going to the gym regularly.  Health labs with family physician.  Colono 10/2017.     Past medical history,surgical history, family history and social history were all reviewed and documented in the EPIC chart.  Gynecologic History Patient's last menstrual period was 05/01/2021 (exact date).  Obstetric History OB History  Gravida Para Term Preterm AB Living  2 2       2   SAB IAB Ectopic Multiple Live Births               # Outcome Date GA Lbr Len/2nd Weight Sex Delivery Anes PTL Lv  2 Para     F Vag-Spont     1 67     M Vag-Spont        ROS: A ROS was performed and pertinent positives and negatives are included in the history.  GENERAL: No fevers or chills. HEENT: No change in vision, no earache, sore throat or sinus congestion. NECK: No pain or stiffness. CARDIOVASCULAR: No chest pain or pressure. No palpitations. PULMONARY: No shortness of breath, cough or wheeze. GASTROINTESTINAL: No abdominal pain, nausea, vomiting or diarrhea, melena or bright red blood per rectum. GENITOURINARY: No urinary frequency, urgency, hesitancy or dysuria. MUSCULOSKELETAL: No joint or muscle pain, no back pain, no recent trauma. DERMATOLOGIC: No rash, no itching, no lesions. ENDOCRINE: No polyuria, polydipsia, no heat or cold intolerance. No recent change in weight. HEMATOLOGICAL: No anemia or easy bruising or bleeding. NEUROLOGIC: No headache, seizures, numbness, tingling or  weakness. PSYCHIATRIC: No depression, no loss of interest in normal activity or change in sleep pattern.     Exam:   BP 114/72    Pulse 80    Resp 16    Ht 5' 2.5" (1.588 m)    Wt 163 lb (73.9 kg)    LMP 05/01/2021 (Exact Date)    BMI 29.34 kg/m   Body mass index is 29.34 kg/m.  General appearance : Well developed well nourished female. No acute distress HEENT: Eyes: no retinal hemorrhage or exudates,  Neck supple, trachea midline, no carotid bruits, no thyroidmegaly Lungs: Clear to auscultation, no rhonchi or wheezes, or rib retractions  Heart: Regular rate and rhythm, no murmurs or gallops Breast:Examined in sitting and supine position were symmetrical in appearance, no palpable masses or tenderness,  no skin retraction, no nipple inversion, no nipple discharge, no skin discoloration, no axillary or supraclavicular lymphadenopathy Abdomen: no palpable masses or tenderness, no rebound or guarding Extremities: no edema or skin discoloration or tenderness  Pelvic: Vulva: Normal             Vagina: No gross lesions or discharge  Cervix: No gross lesions or discharge.  Pap reflex done.  Uterus  AV, normal size, shape and consistency, non-tender and mobile  Adnexa  Without masses or tenderness  Anus: Normal   Assessment/Plan:  51 y.o. female for annual exam  1. Encounter for routine gynecological examination with Papanicolaou smear of cervix Menstrual periods every month with normal flow.  No breakthrough bleeding.  No pelvic pain.  No pain with intercourse.  Using the natural rhythm method and declines other methods of contraception.  Pap 06/11/2018 Neg.  Pap reflex today.  Breast normal.  Mammo Neg 09/2020.  Urine and bowel movements normal.  Body mass index 29.34.  Good muscle mass, will start back going to the gym regularly.  Health labs with family physician.  [facility] 10/2017. - Cytology - PAP( [facility])  2. Encounter for other general counseling or advice on contraception   Menstrual periods every month with normal flow.  No breakthrough bleeding.  No pelvic pain.  No pain with intercourse.  Using the natural rhythm method and declines other methods of contraception.   Princess Bruins MD, 2:05 PM 05/10/2021

## 2021-05-14 LAB — CYTOLOGY - PAP: Diagnosis: NEGATIVE

## 2021-06-04 IMAGING — MG DIGITAL SCREENING BILAT W/ TOMO W/ CAD
6 of 10 series · 6 of 30 positions shown · non-contrast
Comparison: Previous exam(s).

CLINICAL DATA: Screening.

EXAM:
DIGITAL SCREENING BILATERAL MAMMOGRAM WITH TOMO AND CAD

[L MLO synth-2D (1 of 2)]
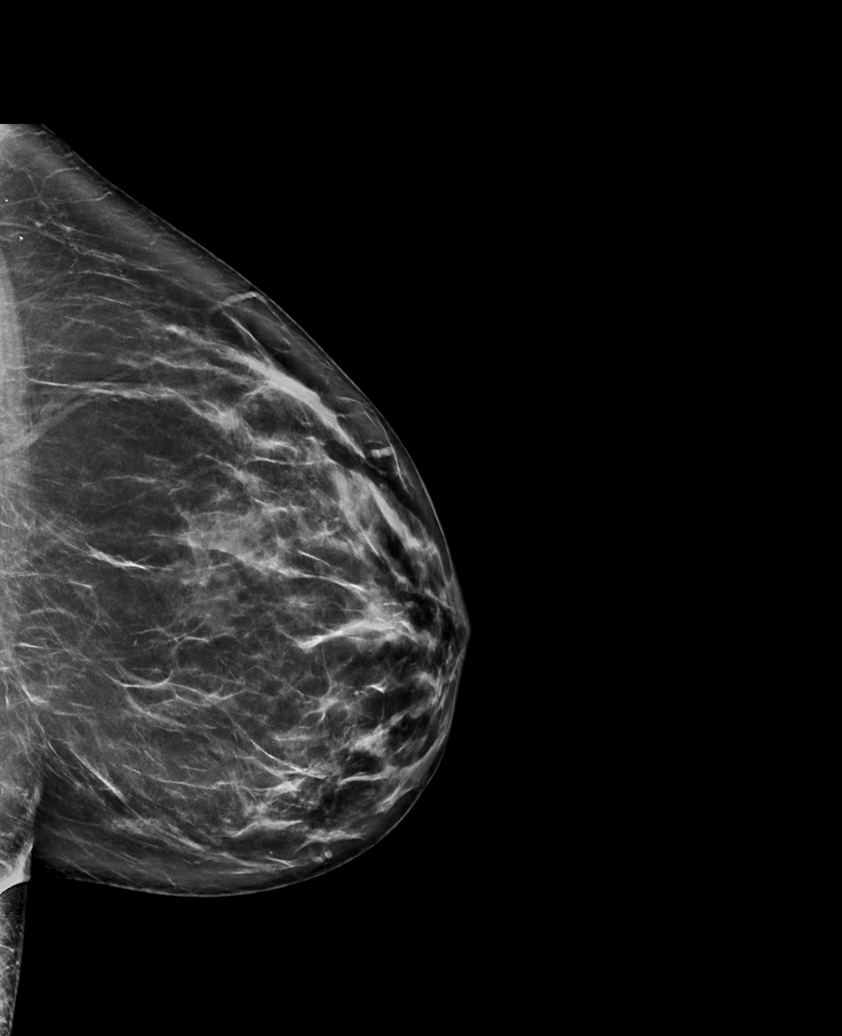

[L MLO synth-2D (2 of 2)]
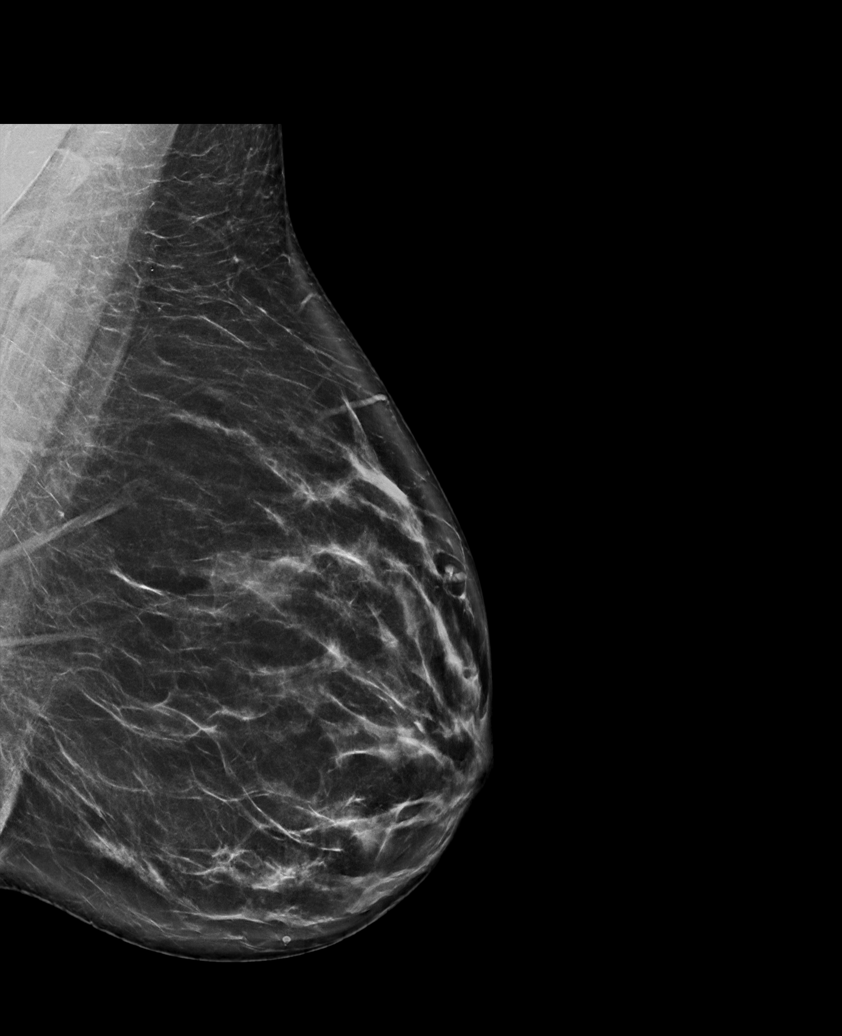

[R CC synth-2D]
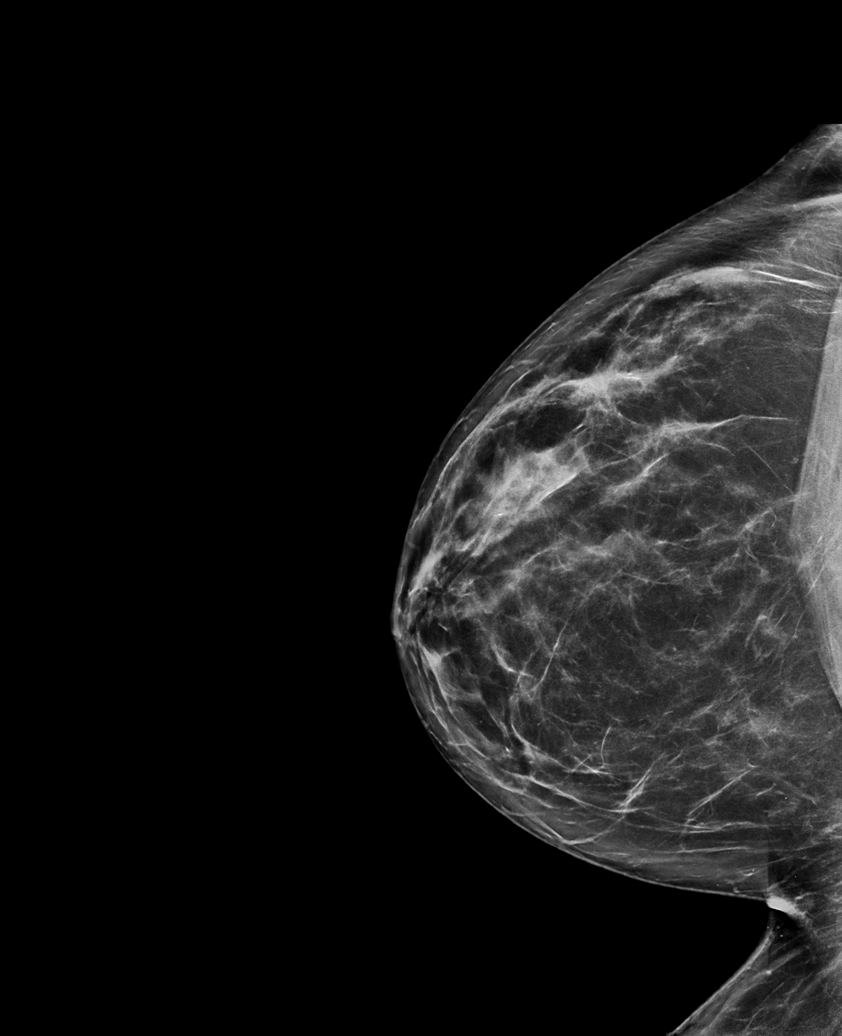

[L CC synth-2D]
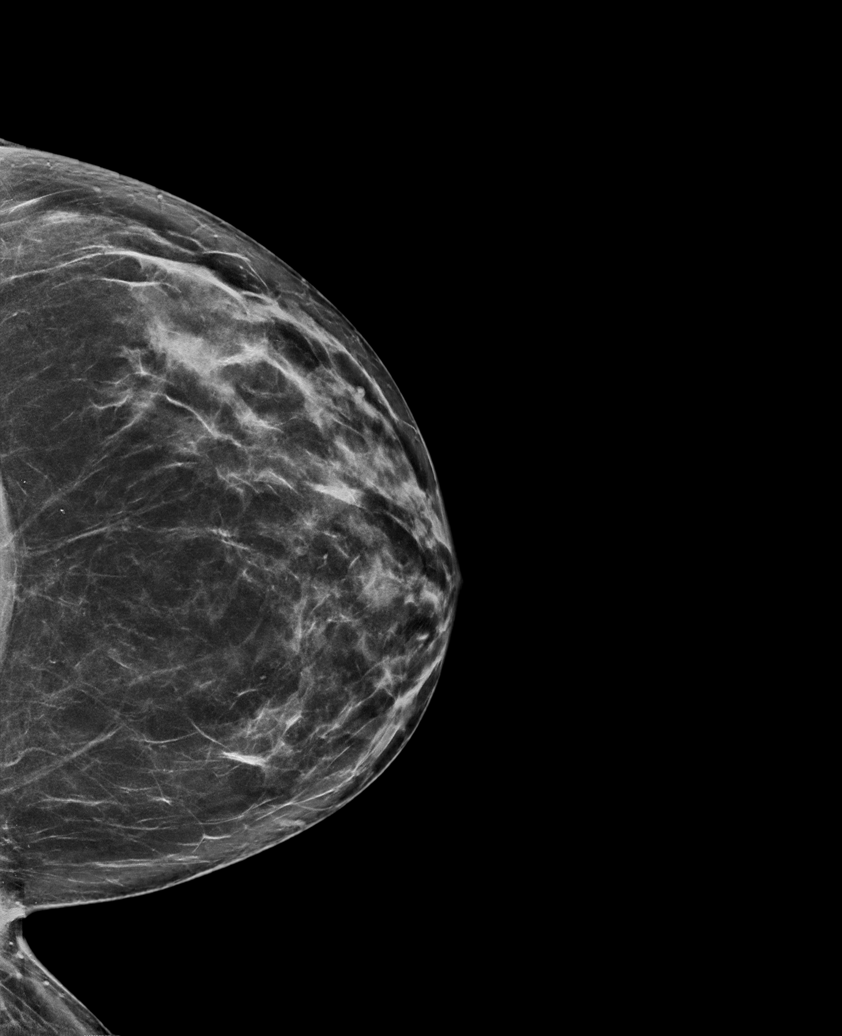

[R MLO synth-2D]
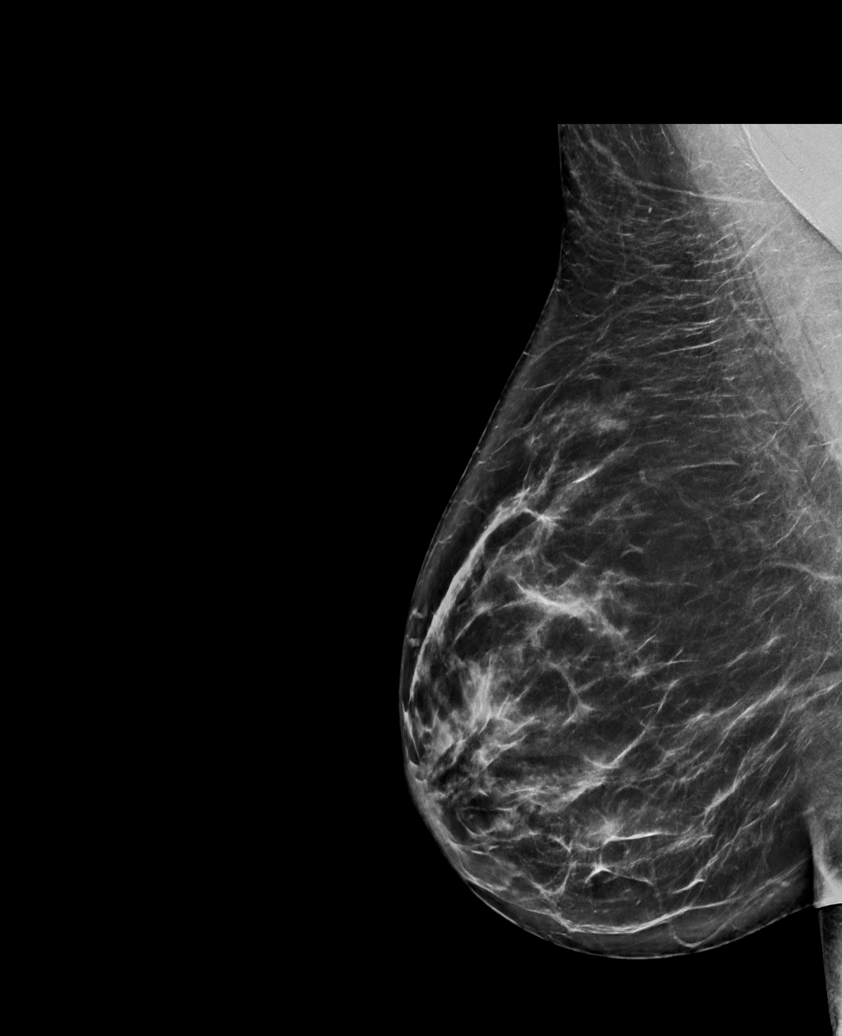

[L MLO tomo · tomo slice 45/90.0]
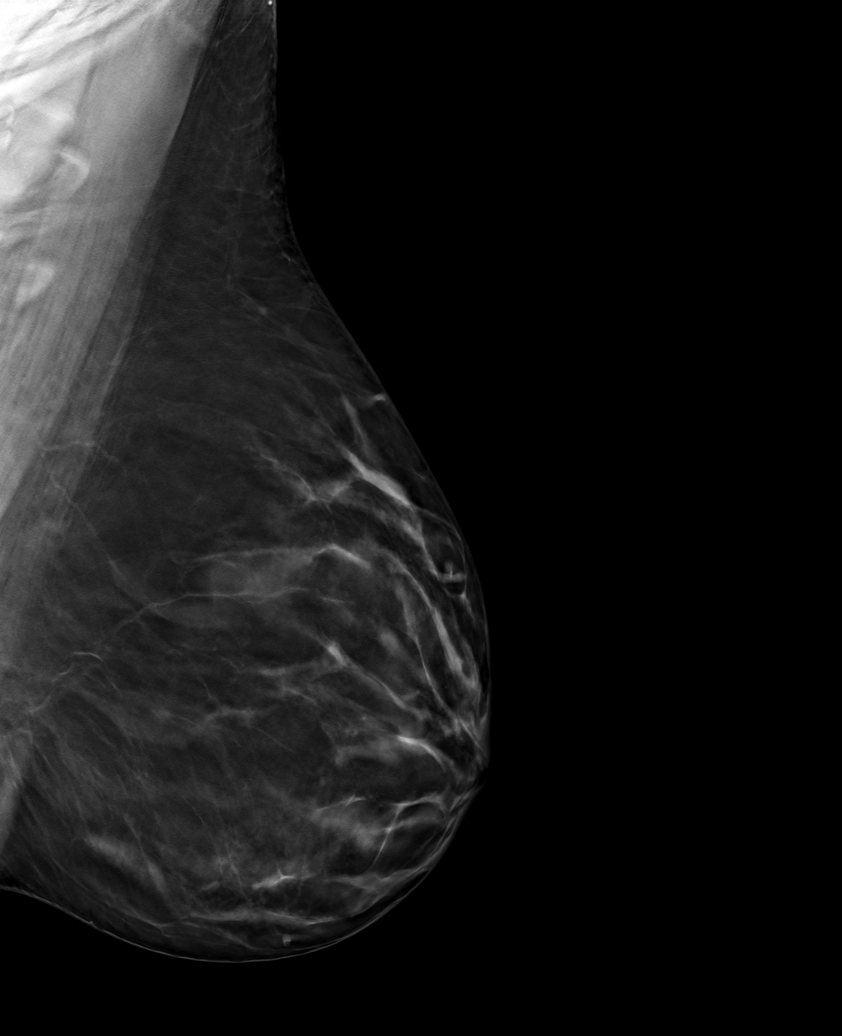

[6 of 30 positions shown; findings below may reference images not displayed]

ACR Breast Density Category b: There are scattered areas of
fibroglandular density.
FINDINGS: There are no findings suspicious for malignancy. Images were
processed with CAD.
IMPRESSION: No mammographic evidence of malignancy. A result letter of this
screening mammogram will be mailed directly to the patient.

RECOMMENDATION:
Screening mammogram in one year. (Code:[id number])

BI-RADS CATEGORY  1: Negative.

## 2021-10-02 ENCOUNTER — Encounter: Payer: Self-pay | Admitting: Obstetrics & Gynecology

## 2022-05-14 ENCOUNTER — Ambulatory Visit: Payer: BC Managed Care – PPO | Admitting: Obstetrics & Gynecology

## 2022-08-07 ENCOUNTER — Ambulatory Visit: Payer: BC Managed Care – PPO | Admitting: Obstetrics & Gynecology

## 2022-09-28 ENCOUNTER — Telehealth: Payer: Self-pay | Admitting: Obstetrics and Gynecology

## 2022-09-28 NOTE — Telephone Encounter (Signed)
Returned call from Gyn answer service ID [id number]  Patient called stating aving increased pelvic pressure and discomfort for the last few weeks and today felt a bulge while wiping, using a mirror and confirmed bulge. No bleeding or abnormal discharge. Not tried to digitally reduced. Going on vacation and wondering should she cancel due to concern for prolapse.  Advised to attempt to reduce and kegels. No need to cancel vacation unless having significant bleeding. Can use lubricant to keep bulge moisturized and have evaluated at upcoming appointment. Briefly reviewed options of PFPT and pessary.

## 2022-10-09 ENCOUNTER — Encounter: Payer: Self-pay | Admitting: Obstetrics & Gynecology

## 2022-10-16 ENCOUNTER — Ambulatory Visit (INDEPENDENT_AMBULATORY_CARE_PROVIDER_SITE_OTHER): Payer: BC Managed Care – PPO | Admitting: Obstetrics & Gynecology

## 2022-10-16 ENCOUNTER — Encounter: Payer: Self-pay | Admitting: Obstetrics & Gynecology

## 2022-10-16 VITALS — BP 112/70 | HR 100 | Ht 62.75 in | Wt 171.0 lb

## 2022-10-16 DIAGNOSIS — N814 Uterovaginal prolapse, unspecified: Secondary | ICD-10-CM | POA: Diagnosis not present

## 2022-10-16 DIAGNOSIS — Z01419 Encounter for gynecological examination (general) (routine) without abnormal findings: Secondary | ICD-10-CM

## 2022-10-16 DIAGNOSIS — Z3009 Encounter for other general counseling and advice on contraception: Secondary | ICD-10-CM

## 2022-10-16 NOTE — Progress Notes (Signed)
Eileen Fleming Nov 18, 1970 [id number]   History:    52 y.o. G2P2L2 Married.  Daughter 59 yo, son 20 yo   RP:  Established patient presenting for annual gyn exam    HPI: Menstrual periods every month with normal flow.  No breakthrough bleeding.  No pelvic pain, but pressure in the vagina and sees a round bulge flush with the vulva.  No pain with intercourse.  Using the natural rhythm method and declines other methods of contraception.  Pap 04/2021 Neg. Repeat at 3 years. Breasts normal.  Mammo Neg 09/2022.  Urine and bowel movements normal. Body mass index 30.53.  Good muscle mass, going to the gym regularly.  Health labs with family physician.  Colono 10/2017.     Past medical history,surgical history, family history and social history were all reviewed and documented in the EPIC chart.  Gynecologic History Patient's last menstrual period was 10/10/2022 (exact date).  Obstetric History OB History  Gravida Para Term Preterm AB Living  2 2       2   SAB IAB Ectopic Multiple Live Births               # Outcome Date GA Lbr Len/2nd Weight Sex Delivery Anes PTL Lv  2 Para     F Vag-Spont     1 Para     M Vag-Spont        ROS: A ROS was performed and pertinent positives and negatives are included in the history. GENERAL: No fevers or chills. HEENT: No change in vision, no earache, sore throat or sinus congestion. NECK: No pain or stiffness. CARDIOVASCULAR: No chest pain or pressure. No palpitations. PULMONARY: No shortness of breath, cough or wheeze. GASTROINTESTINAL: No abdominal pain, nausea, vomiting or diarrhea, melena or bright red blood per rectum. GENITOURINARY: No urinary frequency, urgency, hesitancy or dysuria. MUSCULOSKELETAL: No joint or muscle pain, no back pain, no recent trauma. DERMATOLOGIC: No rash, no itching, no lesions. ENDOCRINE: No polyuria, polydipsia, no heat or cold intolerance. No recent change in weight. HEMATOLOGICAL: No anemia or easy bruising or bleeding. NEUROLOGIC:  No headache, seizures, numbness, tingling or weakness. PSYCHIATRIC: No depression, no loss of interest in normal activity or change in sleep pattern.     Exam:   BP 112/70   Pulse 100   Ht 5' 2.75" (1.594 m)   Wt 171 lb (77.6 kg)   LMP 10/10/2022 (Exact Date) Comment: sexually active, no contraception  SpO2 97%   BMI 30.53 kg/m   Body mass index is 30.53 kg/m.  General appearance : Well developed well nourished female. No acute distress HEENT: Eyes: no retinal hemorrhage or exudates,  Neck supple, trachea midline, no carotid bruits, no thyroidmegaly Lungs: Clear to auscultation, no rhonchi or wheezes, or rib retractions  Heart: Regular rate and rhythm, no murmurs or gallops Breast:Examined in sitting and supine position were symmetrical in appearance, no palpable masses or tenderness,  no skin retraction, no nipple inversion, no nipple discharge, no skin discoloration, no axillary or supraclavicular lymphadenopathy Abdomen: no palpable masses or tenderness, no rebound or guarding Extremities: no edema or skin discoloration or tenderness  Pelvic: Vulva: Normal             Vagina: No gross lesions or discharge  Cervix: No gross lesions or discharge  Uterus  AV, normal size, shape and consistency, non-tender and mobile  Adnexa  Without masses or tenderness  Anus: Normal  Standing with Valsalva:  Cervix felt just at the introitus.  Elongated cervix with a Grade 1-2/3 uterine prolapse.  Cystocele grade 1/4.  No Rectocele.   Assessment/Plan:  52 y.o. female for annual exam   1. Well female exam with routine gynecological exam Menstrual periods every month with normal flow.  No breakthrough bleeding.  No pelvic pain, but pressure in the vagina and sees a round bulge flush with the vulva.  No pain with intercourse.  Using the natural rhythm method and declines other methods of contraception.  Pap 04/2021 Neg. Repeat at 3 years. Breasts normal.  Mammo Neg 09/2022. Urine and bowel  movements normal. Body mass index 30.53.  Good muscle mass, going to the gym regularly.  Health labs with family physician.  [facility]ono 10/2017.  2. Encounter for other general counseling or advice on contraception Using the natural rhythm method and declines other methods of contraception.   3. Uterine prolapse grade 1-2/3 with elongated cervix and Cystocele grade 1/4  No pelvic pain, but pressure in the vagina and sees a round bulge flush with the vulva. Standing with Valsalva:  Cervix felt just at the introitus.  Elongated cervix with a Grade 1-2/3 uterine prolapse.  Cystocele grade 1/4.  No Rectocele. Counseling on pelvic floor weakness.  Recommend avoiding pelvic floor pressure.  Kegel exercises recommended.  Avoid overfilling her bladder.  PT, pessary and surgical correction reviewed.  Will observe at this time.  [name] MD, 2:41 PM

## 2022-10-23 ENCOUNTER — Encounter: Payer: Self-pay | Admitting: Obstetrics & Gynecology

## 2022-10-23 ENCOUNTER — Ambulatory Visit (INDEPENDENT_AMBULATORY_CARE_PROVIDER_SITE_OTHER): Payer: BC Managed Care – PPO | Admitting: Obstetrics & Gynecology

## 2022-10-23 VITALS — BP 124/82 | HR 72

## 2022-10-23 DIAGNOSIS — N814 Uterovaginal prolapse, unspecified: Secondary | ICD-10-CM

## 2022-10-23 NOTE — Progress Notes (Unsigned)
    Eileen Fleming 19-May-1970 [id number]        52 y.o.  G2P2L2 married   RP: Worsening prolapse symptoms  HPI: Worsening vaginal bulging, cramping and pressure in the groin area and vagina.  Seen 10/16/22 for Annual Gyn exam, at which time she had those Sxs, but now they are worsening and not tolerable anymore.  No UTI Sx.  At the 10/16/22 visit we noted: Menstrual periods every month with normal flow.  No breakthrough bleeding.  No pelvic pain, but pressure in the vagina and sees a round bulge flush with the vulva.  No pain with intercourse.  Using the natural rhythm method and declines other methods of contraception.  Pap 04/2021 Neg.    OB History  Gravida Para Term Preterm AB Living  2 2       2   SAB IAB Ectopic Multiple Live Births               # Outcome Date GA Lbr Len/2nd Weight Sex Delivery Anes PTL Lv  2 Para     F Vag-Spont     1 Para     M Vag-Spont       Past medical history,surgical history, problem list, medications, allergies, family history and social history were all reviewed and documented in the EPIC chart.   Directed ROS with pertinent positives and negatives documented in the history of present illness/assessment and plan.  Exam:  Vitals:   10/23/22 1007  BP: 124/82  Pulse: 72  SpO2: 98%   General appearance:  Normal  Abdomen: Normal, soft, no mass felt.  Gynecologic exam: Vulva normal.  Bimanual exam with valsalva: Uterine prolapse grade 3/3 with elongated cervix and Cystocele grade 3/4.  The posterior vaginal wall is holding well.   Assessment/Plan:  52 y.o. G2P2   1. Uterine prolapse grade 3/3 with elongated cervix and Cystocele grade 3/4  Worsening vaginal bulging, cramping and pressure in the groin area and vagina.  Seen 10/16/22 for Annual Gyn exam, at which time she had those Sxs, but now they are worsening and not tolerable anymore.  No UTI Sx.  At the 10/16/22 visit we noted: Menstrual periods every month with normal flow.  No breakthrough bleeding.   No pelvic pain, but pressure in the vagina and sees a round bulge flush with the vulva.  No pain with intercourse.  Using the natural rhythm method and declines other methods of contraception.  Pap 04/2021 Neg.  On Gyn exam today: Bimanual exam with valsalva: Uterine prolapse grade 3/3 with elongated cervix and Cystocele grade 3/4.  The posterior vaginal wall is holding well. Pessary management reviewed with patient.  Patient is not interested at this time.  Will try using a "[employer] shaped" tampon until surgical correction is done.  Refer to Dr. Azucena Freed for surgical management.  [name] MD, 10:45 AM 10/23/2022

## 2022-10-24 ENCOUNTER — Encounter: Payer: Self-pay | Admitting: Obstetrics & Gynecology

## 2023-01-03 HISTORY — PX: COLONOSCOPY WITH PROPOFOL: SHX5780

## 2023-04-23 ENCOUNTER — Ambulatory Visit: Payer: BC Managed Care – PPO | Admitting: Obstetrics

## 2023-06-19 ENCOUNTER — Ambulatory Visit: Payer: BC Managed Care – PPO | Admitting: Obstetrics and Gynecology

## 2023-08-15 ENCOUNTER — Other Ambulatory Visit (HOSPITAL_COMMUNITY)
Admission: RE | Admit: 2023-08-15 | Discharge: 2023-08-15 | Disposition: A | Discharge status: Home / Self Care | Source: Other Acute Inpatient Hospital | Attending: Obstetrics and Gynecology | Admitting: Obstetrics and Gynecology

## 2023-08-15 ENCOUNTER — Ambulatory Visit (INDEPENDENT_AMBULATORY_CARE_PROVIDER_SITE_OTHER): Admitting: Obstetrics and Gynecology

## 2023-08-15 ENCOUNTER — Encounter: Payer: Self-pay | Admitting: Obstetrics and Gynecology

## 2023-08-15 VITALS — BP 135/73 | HR 72 | Ht 62.75 in | Wt 165.0 lb

## 2023-08-15 DIAGNOSIS — R319 Hematuria, unspecified: Secondary | ICD-10-CM | POA: Insufficient documentation

## 2023-08-15 DIAGNOSIS — N812 Incomplete uterovaginal prolapse: Secondary | ICD-10-CM | POA: Diagnosis not present

## 2023-08-15 DIAGNOSIS — R350 Frequency of micturition: Secondary | ICD-10-CM

## 2023-08-15 DIAGNOSIS — N393 Stress incontinence (female) (male): Secondary | ICD-10-CM

## 2023-08-15 LAB — POCT URINALYSIS DIPSTICK
Bilirubin, UA: NEGATIVE
Glucose, UA: NEGATIVE
Ketones, UA: NEGATIVE
Leukocytes, UA: NEGATIVE
Nitrite, UA: NEGATIVE
Protein, UA: NEGATIVE
Spec Grav, UA: 1.02 (ref 1.010–1.025)
Urobilinogen, UA: 0.2 U/dL
pH, UA: 6 (ref 5.0–8.0)

## 2023-08-15 LAB — URINALYSIS, ROUTINE W REFLEX MICROSCOPIC
Bacteria, UA: NONE SEEN
Bilirubin Urine: NEGATIVE
Glucose, UA: NEGATIVE mg/dL
Ketones, ur: NEGATIVE mg/dL
Leukocytes,Ua: NEGATIVE
Nitrite: NEGATIVE
Protein, ur: NEGATIVE mg/dL
Specific Gravity, Urine: 1.003 — ABNORMAL LOW (ref 1.005–1.030)
pH: 7 (ref 5.0–8.0)

## 2023-08-15 NOTE — Patient Instructions (Addendum)
You have a stage 3 (out of 4) prolapse.  We discussed the fact that it is not life threatening but there are several treatment options. For treatment of pelvic organ prolapse, we discussed options for management including expectant management, conservative management, and surgical management, such as Kegels, a pessary, pelvic floor physical therapy, and specific surgical procedures.     URODYNAMICS (UDS) TEST INFORMATION  IMPORTANT: Please try to arrive with a comfortably full bladder!    What is UDS? Urodynamics is a bladder test used to evaluate how your bladder and urethra (tube you urinate out of) work to help find out the cause of your bladder symptoms and evaluate your bladder function in order to make the best treatment plan for you.   What to expect? A nurse will perform the test and will be with you during the entire exam. First we will have to empty your bladder on a special toilet.  After you have emptied your bladder, very small catheters (plastic tubing) will be placed into your bladder and into your vagina (or rectum). These special small catheters measure pressure to help measure your bladder function.  Your bladder will be gently filled with water and you will be asked to cough and strain at several different points during the test.   You will then be asked to empty your bladder in the special toilet with the catheters in place. Most patients can urinate (pee) easily with the catheters in place since the catheters are so small. In total this procedure lasts about 45 minutes to 1 hour.  After your test is completed, you will return (or possibly be seen the same day) to review the results, talk about treatment options and make a plan moving forward.   

## 2023-08-15 NOTE — Progress Notes (Signed)
 New Patient Evaluation and Consultation  Referring Provider: Lory Rough., PA-C PCP: Lory Rough., PA-C Date of Service: 08/15/2023  SUBJECTIVE Chief Complaint: New Patient (Initial Visit) (Eileen Fleming is a 53 y.o. female here for a consult for prolapse./)  History of Present Illness: Eileen Fleming is a 53 y.o. White or Caucasian female seen in consultation at the request of PA Sherolyn Dixon for evaluation of prolapse.     Urinary Symptoms: Leaks urine with cough/ sneeze Leaks 1/ week Does not wear pads Has done pelvic PT  Day time voids 8.  Nocturia: 0 times per night to void. Voiding dysfunction:  empties bladder well.  Patient does not use a catheter to empty bladder.  When urinating, patient feels she has no difficulties Drinks: 2 cups coffee, 80-100oz water  UTIs:  0  UTI's in the last year.   Denies history of blood in urine and kidney or bladder stones  Pelvic Organ Prolapse Symptoms:                  Patient Admits to a feeling of a bulge the vaginal area. It has been present for 9- 10  months.  Patient Admits to seeing a bulge.  This bulge is bothersome. Noticing it more frequently but still comes and goes- does not depend on her acitvity.  Has not had any prior prolapse treatment.   Bowel Symptom: Bowel movements: 1 time(s) per day Stool consistency: soft  Straining: no.  Splinting: no.  Incomplete evacuation: no.  Patient Denies accidental bowel leakage / fecal incontinence Bowel regimen: none  Sexual Function Sexually active: no.  Sexual orientation:  heterosexual Pain with sex: No  Pelvic Pain Denies pelvic pain   Past Medical History:  Past Medical History:  Diagnosis Date   Depression with anxiety    FHx: hypertension    Palpitations 2009   with normal Holter monitor & negative echo      Past Surgical History:   Past Surgical History:  Procedure Laterality Date   HAND TENDON SURGERY     repair to right hand   URETHRA  SURGERY     as an infant to allow better urination   US  ECHOCARDIOGRAPHY  08/26/2007   EF 55-60%     Past OB/GYN History: OB History  Gravida Para Term Preterm AB Living  2 2    2   SAB IAB Ectopic Multiple Live Births      2    # Outcome Date GA Lbr Len/2nd Weight Sex Type Anes PTL Lv  2 Para     F Vag-Spont     1 Para     M Vag-Spont      No tears with delivery Patient's last menstrual period was 08/09/2023.     Component Value Date/Time   DIAGPAP  05/10/2021 1420    - Negative for intraepithelial lesion or malignancy (NILM)   ADEQPAP  05/10/2021 1420    Satisfactory for evaluation; transformation zone component PRESENT.    Medications: Patient currently has no medications in their medication list.   Allergies: Patient is allergic to morphine and codeine.   Social History:  Social History   Tobacco Use   Smoking status: Never   Smokeless tobacco: Never  Vaping Use   Vaping status: Never Used  Substance Use Topics   Alcohol use: Not Currently   Drug use: No    Relationship status: married Patient lives with husband and children.   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 Regular  exercise: Yes: walking History of abuse: No  Family History:   Family History  Problem Relation Age of Onset   Hypertension Mother    Glaucoma Father    Cancer Maternal Grandfather        brain - prostate     Review of Systems: Review of Systems  Constitutional:  Negative for fever, malaise/fatigue and weight loss.  Respiratory:  Negative for cough, shortness of breath and wheezing.   Cardiovascular:  Negative for chest pain, palpitations and leg swelling.  Gastrointestinal:  Negative for abdominal pain and blood in stool.  Genitourinary:  Negative for dysuria.  Musculoskeletal:  Negative for myalgias.  Skin:  Negative for rash.  Neurological:  Negative for dizziness and headaches.  Endo/Heme/Allergies:  Does not bruise/bleed easily.  Psychiatric/Behavioral:  Negative for  depression. The patient is not nervous/anxious.      OBJECTIVE Physical Exam: Vitals:   08/15/23 1259  BP: 135/73  Pulse: 72  Weight: 165 lb (74.8 kg)  Height: 5' 2.75" (1.594 m)    Physical Exam Vitals reviewed. Exam conducted with a chaperone present.  Constitutional:      General: She is not in acute distress. Pulmonary:     Effort: Pulmonary effort is normal.  Abdominal:     General: There is no distension.     Palpations: Abdomen is soft.     Tenderness: There is no abdominal tenderness. There is no rebound.  Musculoskeletal:        General: No swelling. Normal range of motion.  Skin:    General: Skin is warm and dry.     Findings: No rash.  Neurological:     Mental Status: She is alert and oriented to person, place, and time.  Psychiatric:        Mood and Affect: Mood normal.        Behavior: Behavior normal.      GU / Detailed Urogynecologic Evaluation:  Pelvic Exam: Normal external female genitalia; Bartholin's and Skene's glands normal in appearance; urethral meatus normal in appearance, no urethral masses or discharge.   CST: negative  Urethra was prepped with betadine and straight catheter placed. Urine was obtained for sample  Speculum exam reveals normal vaginal mucosa with atrophy. Cervix normal appearance. Uterus normal single, nontender. Adnexa no mass, fullness, tenderness.    Pelvic floor strength I/V, puborectalis III/V external anal sphincter IV/V  Pelvic floor musculature: Right levator non-tender, Right obturator non-tender, Left levator non-tender, Left obturator non-tender  POP-Q:   POP-Q  0                                            Aa   0                                           Ba  2                                              C   5  Gh  6                                            Pb  6.5                                            tvl   -0.5                                             Ap  -0.5                                            Bp  -4                                              D      Rectal Exam:  Normal sphincter tone, moderate distal rectocele, enterocoele not present, no rectal masses  Post-Void Residual (PVR) by Bladder Scan: In order to evaluate bladder emptying, we discussed obtaining a postvoid residual and patient agreed to this procedure.  Procedure: The ultrasound unit was placed on the patient's abdomen in the suprapubic region after the patient had voided.    Post Void Residual - 08/15/23 1311       Post Void Residual   Post Void Residual 7 mL              Laboratory Results: Lab Results  Component Value Date   COLORU Yellow 08/15/2023   CLARITYU Clear 08/15/2023   GLUCOSEUR Negative 08/15/2023   BILIRUBINUR Negative 08/15/2023   KETONESU Negative 08/15/2023   SPECGRAV 1.020 08/15/2023   RBCUR Small 08/15/2023   PHUR 6.0 08/15/2023   PROTEINUR Negative 08/15/2023   UROBILINOGEN 0.2 08/15/2023   LEUKOCYTESUR Negative 08/15/2023    Lab Results  Component Value Date   CREATININE 0.9 09/29/2007    No results found for: "HGBA1C"  Lab Results  Component Value Date   HGB 12.6 09/29/2007     ASSESSMENT AND PLAN Ms. Mohler is a 53 y.o. with:  1. Uterovaginal prolapse, incomplete   2. Urinary frequency   3. Hematuria, unspecified type   4. SUI (stress urinary incontinence, female)     Uterovaginal prolapse, incomplete Assessment & Plan: Stage II anterior, Stage II posterior, Stage III apical prolapse - For treatment of pelvic organ prolapse, we discussed options for management including expectant management, conservative management, and surgical management, such as Kegels, a pessary, pelvic floor physical therapy, and specific surgical procedures. - We discussed two options for prolapse repair:  1) vaginal repair without mesh - Pros - safer, no mesh complications - Cons - not as strong as mesh repair,  higher risk of recurrence  2) laparoscopic repair with mesh - Pros - stronger, better long-term success - Cons - risks of mesh implant (erosion into vagina or bladder, adhering to the rectum, pain) - these risks are lower than with a vaginal  mesh but still exist - Handouts provided on options for her to consider   Urinary frequency -     POCT urinalysis dipstick  Hematuria, unspecified type -     Urine Culture; Future -     Urine Microscopic; Future  SUI (stress urinary incontinence, female) Assessment & Plan: - For treatment of stress urinary incontinence,  non-surgical options include expectant management, weight loss, physical therapy, as well as a pessary.  Surgical options include a midurethral sling, Burch urethropexy, and transurethral injection of a bulking agent. - Will have her undergo urodynamic testing to assess incontinence   Return for UDS and follow up   Arma Lamp, MD

## 2023-08-15 NOTE — Assessment & Plan Note (Signed)
 Stage II anterior, Stage II posterior, Stage III apical prolapse - For treatment of pelvic organ prolapse, we discussed options for management including expectant management, conservative management, and surgical management, such as Kegels, a pessary, pelvic floor physical therapy, and specific surgical procedures. - We discussed two options for prolapse repair:  1) vaginal repair without mesh - Pros - safer, no mesh complications - Cons - not as strong as mesh repair, higher risk of recurrence  2) laparoscopic repair with mesh - Pros - stronger, better long-term success - Cons - risks of mesh implant (erosion into vagina or bladder, adhering to the rectum, pain) - these risks are lower than with a vaginal mesh but still exist - Handouts provided on options for her to consider

## 2023-08-15 NOTE — Assessment & Plan Note (Signed)
-   For treatment of stress urinary incontinence,  non-surgical options include expectant management, weight loss, physical therapy, as well as a pessary.  Surgical options include a midurethral sling, Burch urethropexy, and transurethral injection of a bulking agent. - Will have her undergo urodynamic testing to assess incontinence

## 2023-08-16 LAB — URINE CULTURE: Culture: NO GROWTH

## 2023-08-18 ENCOUNTER — Encounter: Payer: Self-pay | Admitting: Obstetrics and Gynecology

## 2023-09-24 ENCOUNTER — Ambulatory Visit (INDEPENDENT_AMBULATORY_CARE_PROVIDER_SITE_OTHER): Admitting: Obstetrics and Gynecology

## 2023-09-24 ENCOUNTER — Encounter: Payer: Self-pay | Admitting: Obstetrics and Gynecology

## 2023-09-24 VITALS — BP 107/71 | HR 73

## 2023-09-24 DIAGNOSIS — N812 Incomplete uterovaginal prolapse: Secondary | ICD-10-CM

## 2023-09-24 DIAGNOSIS — N393 Stress incontinence (female) (male): Secondary | ICD-10-CM | POA: Diagnosis not present

## 2023-09-24 DIAGNOSIS — R948 Abnormal results of function studies of other organs and systems: Secondary | ICD-10-CM | POA: Diagnosis not present

## 2023-09-24 DIAGNOSIS — R350 Frequency of micturition: Secondary | ICD-10-CM

## 2023-09-24 LAB — POCT URINALYSIS DIP (CLINITEK)
Bilirubin, UA: NEGATIVE
Glucose, UA: NEGATIVE mg/dL
Ketones, POC UA: NEGATIVE mg/dL
Leukocytes, UA: NEGATIVE
Nitrite, UA: NEGATIVE
POC PROTEIN,UA: NEGATIVE
Spec Grav, UA: 1.01 (ref 1.010–1.025)
Urobilinogen, UA: 0.2 U/dL
pH, UA: 7 (ref 5.0–8.0)

## 2023-09-24 NOTE — Progress Notes (Signed)
 [facility] Urodynamics Procedure  Referring Physician: Lory Rough., PA-C Date of Procedure: 09/24/2023  Eileen Fleming is a 53 y.o. female who presents for urodynamic evaluation. Indication(s) for study: occult SUI  Vital Signs: BP 107/71   Pulse 73   Laboratory Results: A catheterized urine specimen revealed:  POC urine:  Lab Results  Component Value Date   COLORU yellow 09/24/2023   CLARITYU clear 09/24/2023   GLUCOSEUR negative 09/24/2023   BILIRUBINUR negative 09/24/2023   KETONESU Negative 08/15/2023   SPECGRAV 1.010 09/24/2023   RBCUR trace-lysed (A) 09/24/2023   PHUR 7.0 09/24/2023   PROTEINUR NEGATIVE 08/15/2023   UROBILINOGEN 0.2 09/24/2023   LEUKOCYTESUR Negative 09/24/2023     Voiding Diary: Deferred  Procedure Timeout:  The correct patient was verified and the correct procedure was verified. The patient was in the correct position and safety precautions were reviewed based on at the patient's history.  Urodynamic Procedure A 70F dual lumen urodynamics catheter was placed under sterile conditions into the patient's bladder. A 70F catheter was placed into the rectum in order to measure abdominal pressure. EMG patches were placed in the appropriate position.  All connections were confirmed and calibrations/adjusted made. Saline was instilled into the bladder through the dual lumen catheters.  Cough/valsalva pressures were measured periodically during filling.  Patient was allowed to void.  The bladder was then emptied of its residual.  UROFLOW: Revealed a Qmax of 27 mL/sec.  She voided 135 mL and had a residual of 325 mL.  It was a interrupted pattern and represented normal habits though interpretation limited due to low voided volume.  CMG: This was performed with sterile water in the sitting position at a fill rate of 30 mL/min.    First sensation of fullness was 129 mLs,  First urge was 164 mLs,  Strong urge was 314 mLs and  Capacity was  687 mLs  Stress incontinence was demonstrated Highest positive Barrier CLPP was 165 cmH20 at 200 ml. Highest positive Barrier VLPP was 88 cmH20 at 200 ml.  Detrusor function was normal, with no phasic contractions seen.    Compliance:  Normal. End fill detrusor pressure was 0cmH20.    UPP: MUCP with barrier reduction was 52 cm of water.    MICTURITION STUDY: Voiding was performed with reduction using scopettes in the sitting position.  Pdet at Qmax was 11 cm of water.  Qmax was 27 mL/sec.  It was a interrupted pattern.  She voided 472 mL and had a residual of 215 mL.  It was a volitional void, sustained detrusor contraction was present and abdominal straining was present  EMG: This was performed with patches.  She had voluntary contractions, recruitment with fill was present and urethral sphincter was not relaxed with void.  The details of the procedure with the study tracings have been scanned into EPIC.   Urodynamic Impression:  1. Sensation was normal; capacity was normal 2. Stress Incontinence was demonstrated at normal pressures; 3. Detrusor Overactivity was not demonstrated. 4. Emptying was dysfunctional with a elevated PVR (325 with Uroflow, and 215 with Micturition study) , a sustained detrusor contraction present,  abdominal straining present, dyssynergic urethral sphincter activity on EMG.  Plan: - The patient will follow up  to discuss the findings and treatment options.

## 2023-09-24 NOTE — Patient Instructions (Signed)
Taking Care of Yourself after Urodynamics   Drink plenty of water for a day or two following your procedure. Try to have about 8 ounces (one cup) at a time, and do this 6 times or more per day unless you have fluid restrictitons AVOID irritative beverages such as coffee, tea, soda, alcoholic or citrus drinks for a day or two, as this may cause burning with urination. You may experience some discomfort or a burning sensation with urination after having this procedure. You can use over the counter Azo or pyridium to help with burning and follow the instructions on the packaging. If it does not improve within 1-2 days, or other symptoms appear (fever, chills, or difficulty urinating) call the office to speak to a nurse.  You may return to normal daily activities such as work, school, driving, exercising and housework on the day of the procedure. If your doctor gave you a prescription, take it as ordered.

## 2023-10-01 ENCOUNTER — Ambulatory Visit: Admitting: Obstetrics and Gynecology

## 2023-10-10 ENCOUNTER — Ambulatory Visit: Admitting: Obstetrics and Gynecology

## 2023-10-22 ENCOUNTER — Ambulatory Visit (INDEPENDENT_AMBULATORY_CARE_PROVIDER_SITE_OTHER): Admitting: Obstetrics and Gynecology

## 2023-10-22 ENCOUNTER — Encounter: Payer: Self-pay | Admitting: Obstetrics and Gynecology

## 2023-10-22 VITALS — BP 108/70 | HR 80

## 2023-10-22 DIAGNOSIS — R339 Retention of urine, unspecified: Secondary | ICD-10-CM

## 2023-10-22 DIAGNOSIS — N812 Incomplete uterovaginal prolapse: Secondary | ICD-10-CM | POA: Diagnosis not present

## 2023-10-22 NOTE — Progress Notes (Signed)
 [facility] Return Visit  SUBJECTIVE  History of Present Illness: Eileen Fleming is a 53 y.o. female seen in follow-up for prolapse. She underwent urodynamic testing and is interested in surgery.   Urodynamic Impression:  1. Sensation was normal; capacity was normal 2. Stress Incontinence was demonstrated at normal pressures; 3. Detrusor Overactivity was not demonstrated. 4. Emptying was dysfunctional with a elevated PVR (325 with Uroflow, and 215 with Micturition study) , a sustained detrusor contraction present,  abdominal straining present, dyssynergic urethral sphincter activity on EMG.    Past Medical History: Patient  has a past medical history of Depression with anxiety, FHx: hypertension, and Palpitations (2009).   Past Surgical History: She  has a past surgical history that includes Hand tendon surgery; Urethra surgery; and US  ECHOCARDIOGRAPHY (08/26/2007).   Medications: She currently has no medications in their medication list.   Allergies: Patient is allergic to morphine and codeine.   Social History: Patient  reports that she has never smoked. She has never used smokeless tobacco. She reports that she does not drink alcohol and does not use drugs.     OBJECTIVE     Physical Exam: Vitals:   10/22/23 1111  BP: 108/70  Pulse: 80   Gen: No apparent distress, A&O x 3.  Detailed Urogynecologic Evaluation:  Deferred. Prior exam showed:  POP-Q (08/15/23)   0                                            Aa   0                                           Ba   2                                              C    5                                            Gh   6                                            Pb   6.5                                            tvl    -0.5                                            Ap   -0.5  Bp   -4                                              D        ASSESSMENT AND  PLAN    Ms. Viger is a 53 y.o. with:  1. Uterovaginal prolapse, incomplete   2. Incomplete bladder emptying    - We discussed that although she has SUI, she also demonstrated incomplete emptying on urodynamic testing.  - Would not recommend concurrent anti-incontinence procedure. Discussed interval procedure if needed.  -She is considering surgery in January. We discussed that if she is having difficulty emptying her bladder in the meantime, then may need to consider doing the surgery sooner. Currently at baseline, she is able to void. Has not had any urinary tract infections.    Plan for surgery: Exam under anesthesia, robotic assisted total laparoscopic hysterectomy, bilateral salpingectomy, sacrocolpopexy, perineorrhaphy, cystoscopy  - We reviewed the patient's specific anatomic and functional findings, with the assistance of diagrams, and together finalized the above procedure. The planned surgical procedures were discussed along with the surgical risks outlined below, which were also provided on a detailed handout. Additional treatment options including expectant management, conservative management, medical management were discussed where appropriate.  We reviewed the benefits and risks of each treatment option.   General Surgical Risks: For all procedures, there are risks of bleeding, infection, damage to surrounding organs including but not limited to bowel, bladder, blood vessels, ureters and nerves, and need for further surgery if an injury were to occur. These risks are all low with minimally invasive surgery.   There are risks of numbness and weakness at any body site or buttock/rectal pain.  It is possible that baseline pain can be worsened by surgery, either with or without mesh. If surgery is vaginal, there is also a low risk of possible conversion to laparoscopy or open abdominal incision where indicated. Very rare risks include blood transfusion, blood clot, heart attack,  pneumonia, or death.   There is also a risk of short-term postoperative urinary retention with need to use a catheter. About half of patients need to go home from surgery with a catheter, which is then later removed in the office. The risk of long-term need for a catheter is very low. There is also a risk of worsening of overactive bladder.    Prolapse (with or without mesh): Risk factors for surgical failure  include things that put pressure on your pelvis and the surgical repair, including obesity, chronic cough, and heavy lifting or straining (including lifting children or adults, straining on the toilet, or lifting heavy objects such as furniture or anything weighing >25 lbs. Risks of recurrence is 20-30% with vaginal native tissue repair and a less than 10% with sacrocolpopexy with mesh.    Sacrocolpopexy: Mesh implants may provide more prolapse support, but do have some unique risks to consider. It is important to understand that mesh is permanent and cannot be easily removed. Risks of abdominal sacrocolpopexy mesh include mesh exposure (~3-6%), painful intercourse (recent studies show lower rates after surgery compared to before, with ~5-8% risk of new onset), and very rare risks of bowel or bladder injury or infection (<1%). The risk of mesh exposure is more likely in a woman with risks for poor healing (prior radiation, poorly controlled diabetes, or immunocompromised). The risk of new or worsened  chronic pain after mesh implant is more common in women with baseline chronic pain and/or poorly controlled anxiety or depression. There is an FDA safety notification on vaginal mesh procedures for prolapse but NOT abdominal mesh procedures and therefore does not apply to your surgery. We have extensive experience and training with mesh placement and we have close postoperative follow up to identify any potential complications from mesh.    - For preop Visit:  She is required to have a visit within 30  days of her surgery.   - Medical clearance: not required  - Anticoagulant use: No - Medicaid Hysterectomy form: No - Accepts blood transfusion: Yes - Expected length of stay: outpatient  Request sent for surgery scheduling.   [name], MD

## 2023-11-04 ENCOUNTER — Telehealth: Payer: Self-pay

## 2023-11-04 ENCOUNTER — Ambulatory Visit: Payer: Self-pay | Admitting: Obstetrics and Gynecology

## 2023-11-04 NOTE — Telephone Encounter (Signed)
 Patient called wanting the surgery scheduling to call her back. Patient states she is ready to schedule surgery but she is in the process of changing jobs and insurance. Patient would like to ask questions in regards to the insurance change and how it would work with obtaining PA of surgery.

## 2023-11-13 ENCOUNTER — Encounter (HOSPITAL_BASED_OUTPATIENT_CLINIC_OR_DEPARTMENT_OTHER): Payer: Self-pay | Admitting: *Deleted

## 2023-11-13 NOTE — Telephone Encounter (Signed)
 I sent pt a MyChart message about 9/9 date for surgery. [employer]ero

## 2023-11-26 ENCOUNTER — Encounter: Payer: Self-pay | Admitting: Obstetrics and Gynecology

## 2023-11-28 ENCOUNTER — Encounter: Payer: Self-pay | Admitting: Obstetrics and Gynecology

## 2023-11-28 NOTE — Telephone Encounter (Signed)
 Surgery scheduled and pt knows KD

## 2023-12-04 ENCOUNTER — Encounter: Payer: Self-pay | Admitting: Obstetrics and Gynecology

## 2023-12-04 ENCOUNTER — Other Ambulatory Visit: Payer: Self-pay | Admitting: Obstetrics and Gynecology

## 2023-12-04 ENCOUNTER — Encounter: Payer: Self-pay | Admitting: *Deleted

## 2023-12-04 ENCOUNTER — Ambulatory Visit (INDEPENDENT_AMBULATORY_CARE_PROVIDER_SITE_OTHER): Admitting: Obstetrics and Gynecology

## 2023-12-04 VITALS — BP 105/72 | HR 71

## 2023-12-04 DIAGNOSIS — Z01818 Encounter for other preprocedural examination: Secondary | ICD-10-CM

## 2023-12-04 DIAGNOSIS — N812 Incomplete uterovaginal prolapse: Secondary | ICD-10-CM

## 2023-12-04 MED ORDER — OXYCODONE HCL 5 MG PO TABS: 5.0000 mg | ORAL_TABLET | ORAL | 0 refills | Status: DC | PRN

## 2023-12-04 MED ORDER — ACETAMINOPHEN 500 MG PO TABS: 500.0000 mg | ORAL_TABLET | Freq: Four times a day (QID) | ORAL | 0 refills | Status: DC | PRN

## 2023-12-04 MED ORDER — POLYETHYLENE GLYCOL 3350 17 GM/SCOOP PO POWD: 17.0000 g | Freq: Every day | ORAL | 0 refills | Status: AC

## 2023-12-04 MED ORDER — ESTRADIOL 0.1 MG/GM VA CREA
0.5000 g | TOPICAL_CREAM | VAGINAL | 11 refills | Status: DC
Start: 2023-12-04 — End: 2024-02-03

## 2023-12-04 MED ORDER — HYDROXYZINE HCL 25 MG PO TABS: 25.0000 mg | ORAL_TABLET | Freq: Every evening | ORAL | 0 refills | Status: DC

## 2023-12-04 MED ORDER — IBUPROFEN 600 MG PO TABS: 600.0000 mg | ORAL_TABLET | Freq: Four times a day (QID) | ORAL | 0 refills | Status: DC | PRN

## 2023-12-04 NOTE — Progress Notes (Signed)
  Urogynecology Pre-Operative Exam  Subjective Chief Complaint: Eileen Fleming presents for a preoperative encounter.   History of Present Illness: Eileen Fleming is a 53 y.o. female who presents for preoperative visit.  She is scheduled to undergo  Exam under anesthesia, robotic assisted total laparoscopic hysterectomy, bilateral salpingectomy, sacrocolpopexy, perineorrhaphy, cystoscopy on 12/22/23.  Her symptoms include pelvic organ prolapse, and she was was found to have Stage II anterior, Stage II posterior, Stage III apical prolapse.   Urodynamics showed: 1. Sensation was normal; capacity was normal 2. Stress Incontinence was demonstrated at normal pressures; 3. Detrusor Overactivity was not demonstrated. 4. Emptying was dysfunctional with a elevated PVR (325 with Uroflow, and 215 with Micturition study) , a sustained detrusor contraction present,  abdominal straining present, dyssynergic urethral sphincter activity on EMG.  Past Medical History:  Diagnosis Date   Depression with anxiety    FHx: hypertension    Palpitations 2009   with normal Holter monitor & negative echo      Past Surgical History:  Procedure Laterality Date   HAND TENDON SURGERY     repair to right hand   URETHRA SURGERY     as an infant to allow better urination   US  ECHOCARDIOGRAPHY  08/26/2007   EF 55-60%    is allergic to morphine and codeine.   Family History  Problem Relation Age of Onset   Hypertension Mother    Glaucoma Father    Cancer Maternal Grandfather        brain - prostate   Uterine cancer Neg Hx    Bladder Cancer Neg Hx    Renal cancer Neg Hx     Social History   Tobacco Use   Smoking status: Never   Smokeless tobacco: Never  Vaping Use   Vaping status: Never Used  Substance Use Topics   Alcohol use: Never   Drug use: No     Review of Systems was negative for a full 10 system review except as noted in the History of Present Illness.  No current outpatient  medications on file.   Objective There were no vitals filed for this visit.  Gen: NAD CV: S1 S2 RRR Lungs: Clear to auscultation bilaterally Abd: soft, nontender   Previous Pelvic Exam showed: POP-Q   0                                            Aa   0                                           Ba   2                                              C    5                                            Gh   6  Pb   6.5                                            tvl    -0.5                                            Ap   -0.5                                            Bp   -4                                              D      Assessment/ Plan  Assessment: The patient is a 53 y.o. year old scheduled to undergo Exam under anesthesia, robotic assisted total laparoscopic hysterectomy, bilateral salpingectomy, sacrocolpopexy, perineorrhaphy, cystoscopy. Verbal consent was obtained for these procedures.  Plan: General Surgical Consent: The patient has previously been counseled on alternative treatments, and the decision by the patient and provider was to proceed with the procedure listed above.  For all procedures, there are risks of bleeding, infection, damage to surrounding organs including but not limited to bowel, bladder, blood vessels, ureters and nerves, and need for further surgery if an injury were to occur. These risks are all low with minimally invasive surgery.   There are risks of numbness and weakness at any body site or buttock/rectal pain.  It is possible that baseline pain can be worsened by surgery, either with or without mesh. If surgery is vaginal, there is also a low risk of possible conversion to laparoscopy or open abdominal incision where indicated. Very rare risks include blood transfusion, blood clot, heart attack, pneumonia, or death.   There is also a risk of short-term postoperative urinary retention with need  to use a catheter. About half of patients need to go home from surgery with a catheter, which is then later removed in the office. The risk of long-term need for a catheter is very low. There is also a risk of worsening of overactive bladder.   Prolapse (with or without mesh): Risk factors for surgical failure  include things that put pressure on your pelvis and the surgical repair, including obesity, chronic cough, and heavy lifting or straining (including lifting children or adults, straining on the toilet, or lifting heavy objects such as furniture or anything weighing >25 lbs. Risks of recurrence is 20-30% with vaginal native tissue repair and a less than 10% with sacrocolpopexy with mesh.    Sacrocolpopexy: Mesh implants may provide more prolapse support, but do have some unique risks to consider. It is important to understand that mesh is permanent and cannot be easily removed. Risks of abdominal sacrocolpopexy mesh include mesh exposure (~3-6%), painful intercourse (recent studies show lower rates after surgery compared to before, with ~5-8% risk of new onset), and very rare risks of bowel or bladder injury or infection (<1%). The risk of mesh exposure is more likely in a woman with risks for poor healing (prior radiation,  poorly controlled diabetes, or immunocompromised). The risk of new or worsened chronic pain after mesh implant is more common in women with baseline chronic pain and/or poorly controlled anxiety or depression. There is an FDA safety notification on vaginal mesh procedures for prolapse but NOT abdominal mesh procedures and therefore does not apply to your surgery. We have extensive experience and training with mesh placement and we have close postoperative follow up to identify any potential complications from mesh.    We discussed consent for blood products. Risks for blood transfusion include allergic reactions, other reactions that can affect different body organs and managed  accordingly, transmission of infectious diseases such as HIV or Hepatitis. However, the blood is screened. Patient consents for blood products.  Pre-operative instructions:  She was instructed to not take Aspirin/NSAIDs x 7days prior to surgery.  Antibiotic prophylaxis was ordered as indicated.  Catheter use: Patient will go home with foley if needed after post-operative voiding trial.  Post-operative instructions:  She was provided with specific post-operative instructions, including precautions and signs/symptoms for which we would recommend contacting us , in addition to daytime and after-hours contact phone numbers. This was provided on a handout.   Post-operative medications: Prescriptions for motrin , tylenol , miralax , and oxycodone  were sent to her pharmacy. Discussed using ibuprofen  and tylenol  on a schedule to limit use of narcotics.   Laboratory testing:  We will check labs: CBC and Type and Screen  Preoperative clearance:  She does not require surgical clearance.    Post-operative follow-up:  A post-operative appointment will be made for 6 weeks from the date of surgery. If she needs a post-operative nurse visit for a voiding trial, that will be set up after she leaves the hospital.    Patient will call the clinic or use MyChart should anything change or any new issues arise.   Vinny Taranto G Massiah Minjares, NP

## 2023-12-04 NOTE — H&P (Signed)
 [facility] H&P  Subjective Chief Complaint: Eileen Fleming presents for a preoperative encounter.   History of Present Illness: Eileen Fleming is a 53 y.o. female who presents for preoperative visit.  She is scheduled to undergo  Exam under anesthesia, robotic assisted total laparoscopic hysterectomy, bilateral salpingectomy, sacrocolpopexy, perineorrhaphy, cystoscopy on 12/22/23.  Her symptoms include pelvic organ prolapse, and she was was found to have Stage II anterior, Stage II posterior, Stage III apical prolapse.   Urodynamics showed: 1. Sensation was normal; capacity was normal 2. Stress Incontinence was demonstrated at normal pressures; 3. Detrusor Overactivity was not demonstrated. 4. Emptying was dysfunctional with a elevated PVR (325 with Uroflow, and 215 with Micturition study) , a sustained detrusor contraction present,  abdominal straining present, dyssynergic urethral sphincter activity on EMG.  Past Medical History:  Diagnosis Date   Depression with anxiety    FHx: hypertension    Palpitations 2009   with normal Holter monitor & negative echo      Past Surgical History:  Procedure Laterality Date   HAND TENDON SURGERY     repair to right hand   URETHRA SURGERY     as an infant to allow better urination   US  ECHOCARDIOGRAPHY  08/26/2007   EF 55-60%    is allergic to morphine and codeine.   Family History  Problem Relation Age of Onset   Hypertension Mother    Glaucoma Father    Cancer Maternal Grandfather        brain - prostate   Uterine cancer Neg Hx    Bladder Cancer Neg Hx    Renal cancer Neg Hx     Social History   Tobacco Use   Smoking status: Never   Smokeless tobacco: Never  Vaping Use   Vaping status: Never Used  Substance Use Topics   Alcohol use: Never   Drug use: No     Review of Systems was negative for a full 10 system review except as noted in the History of Present Illness.  No current facility-administered  medications for this encounter.  Current Outpatient Medications:    acetaminophen  (TYLENOL ) 500 MG tablet, Take 1 tablet (500 mg total) by mouth every 6 (six) hours as needed (pain)., Disp: 30 tablet, Rfl: 0   estradiol  (ESTRACE ) 0.1 MG/GM vaginal cream, Place 0.5 g vaginally 2 (two) times a week. Place 0.5g nightly for two weeks then twice a week after, Disp: 42.5 g, Rfl: 11   hydrOXYzine  (ATARAX ) 25 MG tablet, Take 1 tablet (25 mg total) by mouth at bedtime., Disp: 30 tablet, Rfl: 0   ibuprofen  (ADVIL ) 600 MG tablet, Take 1 tablet (600 mg total) by mouth every 6 (six) hours as needed., Disp: 30 tablet, Rfl: 0   oxyCODONE  (OXY IR/ROXICODONE ) 5 MG immediate release tablet, Take 1 tablet (5 mg total) by mouth every 4 (four) hours as needed for severe pain (pain score 7-10)., Disp: 15 tablet, Rfl: 0   polyethylene glycol powder (GLYCOLAX /MIRALAX ) 17 GM/SCOOP powder, Take 17 g by mouth daily for 1 day. Drink 17g (1 scoop) dissolved in water per day., Disp: 255 g, Rfl: 0   Objective There were no vitals filed for this visit.  Gen: NAD CV: S1 S2 RRR Lungs: Clear to auscultation bilaterally Abd: soft, nontender   Previous Pelvic Exam showed: POP-Q   0  Aa   0                                           Ba   2                                              C    5                                            Gh   6                                            Pb   6.5                                            tvl    -0.5                                            Ap   -0.5                                            Bp   -4                                              D      Assessment/ Plan  Assessment: The patient is a 53 y.o. year old scheduled to undergo Exam under anesthesia, robotic assisted total laparoscopic hysterectomy, bilateral salpingectomy, sacrocolpopexy, perineorrhaphy, cystoscopy. Verbal consent was obtained for these  procedures.

## 2023-12-04 NOTE — Telephone Encounter (Signed)
 Surgery moved to 9/9 1115am arrive 915 to [facility]. Pt informed in person. Sotero CMA

## 2023-12-10 NOTE — Telephone Encounter (Signed)
 Surgery scheduled and only one insurance noted KD

## 2023-12-16 ENCOUNTER — Encounter (HOSPITAL_COMMUNITY): Payer: Self-pay | Admitting: Obstetrics and Gynecology

## 2023-12-16 NOTE — Pre-Procedure Instructions (Signed)
 Surgical Instructions   Your procedure is scheduled on :  Tuesday,  12-23-2023. Report to [facility] Main Entrance A at 9:30  A.M., then check in with the Admitting office. Any questions or running late day of surgery: call [phone]  Questions prior to your surgery date: call [phone], Monday-Friday, 8am-4pm. If you experience any cold or flu symptoms such as cough, fever, chills, shortness of breath, etc. between now and your scheduled surgery, please notify your surgeon office.    Remember:  Do not eat any food after midnight the night before your surgery.  You may have clear liquids from midnight night before surgery until 8:30 AM.    Clear liquids allowed are:  Water Carbonated Beverages Clear Tea (no milk, honey, etc.) Black Coffee Only (NO MILK, CREAM OR POWDERED CREAMER of any kind) Sport drinks, like Gatorade.  NO clear liquids after 8:30 AM day of surgery.  This includes no water,  candy,   gum,  and  mints.    Take these medicines the morning of surgery with A SIP OF WATER :  none   May take these medicines IF NEEDED:  none    One week prior to surgery, STOP taking any Aspirin (unless otherwise instructed by your surgeon) Aleve, Naproxen, Ibuprofen , Motrin , Advil , Goody's, BC's, all herbal medications, fish oil, and non-prescription vitamins.                     Do NOT Smoke (Tobacco/Vaping) and Do Not drink alcohol for 24 hours prior to your procedure.  If you use a CPAP at night, you may bring your mask/headgear for your overnight stay.   You will be asked to remove any contacts, glasses, piercing's, hearing aid's, dentures/partials prior to surgery. Please bring cases for these items if needed.    Patients discharged the day of surgery will not be allowed to drive home, and someone needs to stay with them for 24 hours.  SURGICAL WAITING ROOM VISITATION Patients may have no more than 2 support people in the waiting area - these visitors may rotate.    Pre-op nurse will coordinate an appropriate time for 1 ADULT support person, who may not rotate, to accompany patient in pre-op.  Children under the age of 87 must have an adult with them who is not the patient and must remain in the main waiting area with an adult.  If the patient needs to stay at the hospital during part of their recovery, the visitor guidelines for inpatient rooms apply.  Please refer to the [facility] website for the visitor guidelines for any additional information.   If you received a COVID test during your pre-op visit  it is requested that you wear a mask when out in public, stay away from anyone that may not be feeling well and notify your surgeon if you develop symptoms. If you have been in contact with anyone that has tested positive in the last 10 days please notify you surgeon.      Pre-operative CHG Bathing Instructions   You can play a key role in reducing the risk of infection after surgery. Your skin needs to be as free of germs as possible. You can reduce the number of germs on your skin by washing with CHG (chlorhexidine gluconate) soap before surgery. CHG is an antiseptic soap that kills germs and continues to kill germs even after washing.   DO NOT use if you have an allergy to chlorhexidine/CHG or antibacterial soaps. If  your skin becomes reddened or irritated, stop using the CHG and notify one of our RN day of surgery.             TAKE A SHOWER THE NIGHT BEFORE SURGERY AND THE DAY OF SURGERY    Please keep in mind the following:  DO NOT shave, including legs and genital area, 48 hours prior to surgery.   You may shave your face before/day of surgery.  Place clean sheets on your bed the night before surgery Use a clean washcloth (not used since being washed) for each shower. DO NOT sleep with pet's night before surgery.  CHG Shower Instructions:  Wash your face and private area with normal soap. If you choose to wash your hair, wash first with  your normal shampoo.  After you use shampoo/soap, rinse your hair and body thoroughly to remove shampoo/soap residue.  Turn the water OFF and apply half the bottle of CHG soap to a CLEAN washcloth.  Apply CHG soap ONLY FROM YOUR NECK DOWN TO YOUR TOES (washing for 3-5 minutes)  DO NOT use CHG soap on face, private areas, open wounds, or sores.  Pay special attention to the area where your surgery is being performed.  If you are having back surgery, having someone wash your back for you may be helpful. Wait 2 minutes after CHG soap is applied, then you may rinse off the CHG soap.  Pat dry with a clean towel  Put on clean pajamas    Additional instructions for the day of surgery: DO NOT APPLY any lotions,  powder,  oils,  deodorants (may use underarm deodorant) , cologne/ perfumes or makeup Do not wear jewelry /  piercing's/  metal/  permanent jewelry must be removed prior to arrival day of surgery.  (No plastic piercing) Do not wear nail polish, gel polish, artificial nails, or any other type of covering on natural finger nails (toe nails are okay) Do not bring valuables to the hospital. [facility] is not responsible for valuables/personal belongings. Put on clean/comfortable clothes.  Please brush your teeth.  Ask your nurse before applying any prescription medications to the skin.

## 2023-12-16 NOTE — Progress Notes (Signed)
 Spoke w/ via phone for pre-op interview--- pt Lab needs dos----    upt     Lab results------ lab appt 12-22-2023 @ 0930 getting CBC/T&S COVID test -----patient states asymptomatic no test needed Arrive at ------- 0930 on 12-23-2023 NPO after MN NO Solid Food.  Clear liquids from MN until--- 0830 Pre-Surgery Ensure or G2: n/a  Med rec completed Medications to take morning of surgery ----- none Diabetic medication ----- n/a  GLP1 agonist last dose: n/a GLP1 instructions:  Patient instructed no nail polish to be worn day of surgery Patient instructed to bring photo id and insurance card day of surgery Patient aware to have Driver (ride ) / caregiver    for 24 hours after surgery - husband, Eileen Fleming Patient Special Instructions ----- will pick up soap and written instructions at lab appt Pre-Op special Instructions ----- n/a  Patient verbalized understanding of instructions that were given at this phone interview. Patient denies chest pain, sob, fever, cough at the interview.

## 2023-12-17 ENCOUNTER — Encounter: Payer: Self-pay | Admitting: Obstetrics and Gynecology

## 2023-12-22 ENCOUNTER — Encounter (HOSPITAL_COMMUNITY)
Admission: RE | Admit: 2023-12-22 | Discharge: 2023-12-22 | Disposition: A | Discharge status: Home / Self Care | Source: Ambulatory Visit | Attending: Obstetrics and Gynecology

## 2023-12-22 DIAGNOSIS — N812 Incomplete uterovaginal prolapse: Secondary | ICD-10-CM | POA: Diagnosis not present

## 2023-12-22 DIAGNOSIS — Z01812 Encounter for preprocedural laboratory examination: Secondary | ICD-10-CM | POA: Insufficient documentation

## 2023-12-22 LAB — CBC
HCT: 41.4 % (ref 36.0–46.0)
Hemoglobin: 13.5 g/dL (ref 12.0–15.0)
MCH: 30.8 pg (ref 26.0–34.0)
MCHC: 32.6 g/dL (ref 30.0–36.0)
MCV: 94.3 fL (ref 80.0–100.0)
Platelets: 214 K/uL (ref 150–400)
RBC: 4.39 MIL/uL (ref 3.87–5.11)
RDW: 12.5 % (ref 11.5–15.5)
WBC: 6.5 K/uL (ref 4.0–10.5)
nRBC: 0 % (ref 0.0–0.2)

## 2023-12-22 LAB — TYPE AND SCREEN
ABO/RH(D): O POS
Antibody Screen: NEGATIVE

## 2023-12-23 ENCOUNTER — Ambulatory Visit (HOSPITAL_COMMUNITY)
Admission: RE | Admit: 2023-12-23 | Discharge: 2023-12-23 | Disposition: A | Discharge status: Home / Self Care | Attending: Obstetrics and Gynecology | Admitting: Obstetrics and Gynecology

## 2023-12-23 ENCOUNTER — Other Ambulatory Visit: Payer: Self-pay

## 2023-12-23 ENCOUNTER — Encounter (HOSPITAL_COMMUNITY): Payer: Self-pay | Admitting: Obstetrics and Gynecology

## 2023-12-23 ENCOUNTER — Ambulatory Visit (HOSPITAL_COMMUNITY): Admitting: Anesthesiology

## 2023-12-23 ENCOUNTER — Encounter (HOSPITAL_COMMUNITY)
Admission: RE | Disposition: A | Discharge status: Home / Self Care | Payer: Self-pay | Source: Home / Self Care | Attending: Obstetrics and Gynecology

## 2023-12-23 DIAGNOSIS — N812 Incomplete uterovaginal prolapse: Secondary | ICD-10-CM | POA: Diagnosis present

## 2023-12-23 DIAGNOSIS — Z79818 Long term (current) use of other agents affecting estrogen receptors and estrogen levels: Secondary | ICD-10-CM | POA: Insufficient documentation

## 2023-12-23 DIAGNOSIS — F32A Depression, unspecified: Secondary | ICD-10-CM | POA: Insufficient documentation

## 2023-12-23 DIAGNOSIS — N393 Stress incontinence (female) (male): Secondary | ICD-10-CM | POA: Diagnosis not present

## 2023-12-23 DIAGNOSIS — F419 Anxiety disorder, unspecified: Secondary | ICD-10-CM | POA: Insufficient documentation

## 2023-12-23 DIAGNOSIS — Z01818 Encounter for other preprocedural examination: Secondary | ICD-10-CM

## 2023-12-23 DIAGNOSIS — N838 Other noninflammatory disorders of ovary, fallopian tube and broad ligament: Secondary | ICD-10-CM | POA: Insufficient documentation

## 2023-12-23 HISTORY — DX: Incomplete uterovaginal prolapse: N81.2

## 2023-12-23 HISTORY — DX: Presence of spectacles and contact lenses: Z97.3

## 2023-12-23 HISTORY — DX: Stress incontinence (female) (male): N39.3

## 2023-12-23 HISTORY — PX: XI ROBOTIC ASSISTED TOTAL HYSTERECTOMY WITH SACROCOLPOPEXY: SHX6825

## 2023-12-23 HISTORY — DX: Retention of urine, unspecified: R33.9

## 2023-12-23 HISTORY — PX: PERINEOPLASTY: SHX2218

## 2023-12-23 HISTORY — DX: Allergic contact dermatitis due to plants, except food: L23.7

## 2023-12-23 HISTORY — PX: CYSTOSCOPY: SHX5120

## 2023-12-23 LAB — POCT PREGNANCY, URINE: Preg Test, Ur: NEGATIVE

## 2023-12-23 LAB — ABO/RH: ABO/RH(D): O POS

## 2023-12-23 SURGERY — HYSTERECTOMY, TOTAL, ROBOT-ASSISTED, WITH SACROCOLPOPEXY
Anesthesia: General | Site: Perineum

## 2023-12-23 MED ORDER — LIDOCAINE 2% (20 MG/ML) 5 ML SYRINGE
INTRAMUSCULAR | Status: DC | PRN
Start: 2023-12-23 — End: 2023-12-23
  Administered 2023-12-23: 60 mg via INTRAVENOUS

## 2023-12-23 MED ORDER — SODIUM CHLORIDE 0.9 % IV SOLN: INTRAVENOUS | Status: DC | PRN

## 2023-12-23 MED ORDER — ONDANSETRON HCL 4 MG/2ML IJ SOLN
INTRAMUSCULAR | Status: AC
  Filled 2023-12-23: qty 2

## 2023-12-23 MED ORDER — METRONIDAZOLE 500 MG/100ML IV SOLN
INTRAVENOUS | Status: AC
  Filled 2023-12-23: qty 100

## 2023-12-23 MED ORDER — SUGAMMADEX SODIUM 200 MG/2ML IV SOLN
INTRAVENOUS | Status: DC | PRN
Start: 2023-12-23 — End: 2023-12-23
  Administered 2023-12-23: 145.2 mg via INTRAVENOUS

## 2023-12-23 MED ORDER — LIDOCAINE-EPINEPHRINE 1 %-1:100000 IJ SOLN
INTRAMUSCULAR | Status: AC
Start: 2023-12-23 — End: 2023-12-23
  Filled 2023-12-23: qty 1

## 2023-12-23 MED ORDER — HYDROMORPHONE HCL 1 MG/ML IJ SOLN
INTRAMUSCULAR | Status: AC
  Filled 2023-12-23: qty 1

## 2023-12-23 MED ORDER — DEXAMETHASONE SODIUM PHOSPHATE 10 MG/ML IJ SOLN
INTRAMUSCULAR | Status: AC
Start: 2023-12-23 — End: 2023-12-23
  Filled 2023-12-23: qty 1

## 2023-12-23 MED ORDER — PHENAZOPYRIDINE HCL 100 MG PO TABS
ORAL_TABLET | ORAL | Status: AC
  Filled 2023-12-23: qty 2

## 2023-12-23 MED ORDER — SCOPOLAMINE 1 MG/3DAYS TD PT72
1.0000 | MEDICATED_PATCH | TRANSDERMAL | Status: DC
  Administered 2023-12-23: 1 mg via TRANSDERMAL

## 2023-12-23 MED ORDER — SCOPOLAMINE 1 MG/3DAYS TD PT72
MEDICATED_PATCH | TRANSDERMAL | Status: AC
  Filled 2023-12-23: qty 1

## 2023-12-23 MED ORDER — CEFAZOLIN SODIUM-DEXTROSE 2-4 GM/100ML-% IV SOLN
INTRAVENOUS | Status: AC
  Filled 2023-12-23: qty 100

## 2023-12-23 MED ORDER — PHENYLEPHRINE 80 MCG/ML (10ML) SYRINGE FOR IV PUSH (FOR BLOOD PRESSURE SUPPORT)
PREFILLED_SYRINGE | INTRAVENOUS | Status: DC | PRN
  Administered 2023-12-23: 80 ug via INTRAVENOUS

## 2023-12-23 MED ORDER — PROPOFOL 10 MG/ML IV BOLUS
INTRAVENOUS | Status: AC
  Filled 2023-12-23: qty 20

## 2023-12-23 MED ORDER — ACETAMINOPHEN 500 MG PO TABS
ORAL_TABLET | ORAL | Status: AC
  Filled 2023-12-23: qty 2

## 2023-12-23 MED ORDER — GABAPENTIN 300 MG PO CAPS
300.0000 mg | ORAL_CAPSULE | ORAL | Status: AC
  Administered 2023-12-23: 300 mg via ORAL

## 2023-12-23 MED ORDER — HYDROMORPHONE HCL 1 MG/ML IJ SOLN
0.2500 mg | INTRAMUSCULAR | Status: DC | PRN
  Administered 2023-12-23 (×2): 0.5 mg via INTRAVENOUS

## 2023-12-23 MED ORDER — CEFAZOLIN SODIUM-DEXTROSE 2-4 GM/100ML-% IV SOLN
2.0000 g | INTRAVENOUS | Status: AC
  Administered 2023-12-23: 2 g via INTRAVENOUS

## 2023-12-23 MED ORDER — METRONIDAZOLE 500 MG/100ML IV SOLN
500.0000 mg | Freq: Once | INTRAVENOUS | Status: AC
  Administered 2023-12-23: 500 mg via INTRAVENOUS

## 2023-12-23 MED ORDER — MIDAZOLAM HCL 2 MG/2ML IJ SOLN
INTRAMUSCULAR | Status: DC | PRN
  Administered 2023-12-23: 2 mg via INTRAVENOUS

## 2023-12-23 MED ORDER — CHLORHEXIDINE GLUCONATE 0.12 % MT SOLN
15.0000 mL | Freq: Once | OROMUCOSAL | Status: AC
  Administered 2023-12-23: 15 mL via OROMUCOSAL

## 2023-12-23 MED ORDER — ROCURONIUM BROMIDE 10 MG/ML (PF) SYRINGE
PREFILLED_SYRINGE | INTRAVENOUS | Status: AC
  Filled 2023-12-23: qty 10

## 2023-12-23 MED ORDER — SODIUM CHLORIDE 0.9 % IV SOLN: 12.5000 mg | INTRAVENOUS | Status: DC | PRN

## 2023-12-23 MED ORDER — ROCURONIUM BROMIDE 10 MG/ML (PF) SYRINGE
PREFILLED_SYRINGE | INTRAVENOUS | Status: DC | PRN
Start: 2023-12-23 — End: 2023-12-23
  Administered 2023-12-23: 70 mg via INTRAVENOUS
  Administered 2023-12-23: 20 mg via INTRAVENOUS
  Administered 2023-12-23: 30 mg via INTRAVENOUS

## 2023-12-23 MED ORDER — CHLORHEXIDINE GLUCONATE 0.12 % MT SOLN
OROMUCOSAL | Status: AC
  Filled 2023-12-23: qty 15

## 2023-12-23 MED ORDER — ORAL CARE MOUTH RINSE: 15.0000 mL | Freq: Once | OROMUCOSAL | Status: AC

## 2023-12-23 MED ORDER — FENTANYL CITRATE (PF) 250 MCG/5ML IJ SOLN
INTRAMUSCULAR | Status: DC | PRN
  Administered 2023-12-23: 50 ug via INTRAVENOUS
  Administered 2023-12-23: 100 ug via INTRAVENOUS
  Administered 2023-12-23: 50 ug via INTRAVENOUS

## 2023-12-23 MED ORDER — MEPERIDINE HCL 25 MG/ML IJ SOLN: 6.2500 mg | INTRAMUSCULAR | Status: DC | PRN

## 2023-12-23 MED ORDER — OXYCODONE HCL 5 MG/5ML PO SOLN: 5.0000 mg | Freq: Once | ORAL | Status: DC | PRN

## 2023-12-23 MED ORDER — MIDAZOLAM HCL 2 MG/2ML IJ SOLN
INTRAMUSCULAR | Status: AC
  Filled 2023-12-23: qty 2

## 2023-12-23 MED ORDER — PHENAZOPYRIDINE HCL 100 MG PO TABS
200.0000 mg | ORAL_TABLET | ORAL | Status: AC
  Administered 2023-12-23: 200 mg via ORAL

## 2023-12-23 MED ORDER — LACTATED RINGERS IV SOLN: INTRAVENOUS | Status: DC

## 2023-12-23 MED ORDER — OXYCODONE HCL 5 MG PO TABS: 5.0000 mg | ORAL_TABLET | Freq: Once | ORAL | Status: DC | PRN

## 2023-12-23 MED ORDER — PHENYLEPHRINE HCL-NACL 20-0.9 MG/250ML-% IV SOLN
INTRAVENOUS | Status: DC | PRN
  Administered 2023-12-23: 20 ug/min via INTRAVENOUS

## 2023-12-23 MED ORDER — GABAPENTIN 300 MG PO CAPS
ORAL_CAPSULE | ORAL | Status: AC
  Filled 2023-12-23: qty 1

## 2023-12-23 MED ORDER — LIDOCAINE-EPINEPHRINE 1 %-1:100000 IJ SOLN
INTRAMUSCULAR | Status: DC | PRN
  Administered 2023-12-23: 5 mL

## 2023-12-23 MED ORDER — LIDOCAINE 2% (20 MG/ML) 5 ML SYRINGE
INTRAMUSCULAR | Status: AC
  Filled 2023-12-23: qty 5

## 2023-12-23 MED ORDER — AMISULPRIDE (ANTIEMETIC) 5 MG/2ML IV SOLN: 10.0000 mg | Freq: Once | INTRAVENOUS | Status: DC | PRN

## 2023-12-23 MED ORDER — DEXAMETHASONE SODIUM PHOSPHATE 10 MG/ML IJ SOLN
INTRAMUSCULAR | Status: DC | PRN
Start: 2023-12-23 — End: 2023-12-23
  Administered 2023-12-23: 10 mg via INTRAVENOUS

## 2023-12-23 MED ORDER — PROPOFOL 10 MG/ML IV BOLUS
INTRAVENOUS | Status: DC | PRN
  Administered 2023-12-23: 130 mg via INTRAVENOUS

## 2023-12-23 MED ORDER — BUPIVACAINE HCL (PF) 0.25 % IJ SOLN
INTRAMUSCULAR | Status: DC | PRN
  Administered 2023-12-23: 17 mL

## 2023-12-23 MED ORDER — ACETAMINOPHEN 500 MG PO TABS
1000.0000 mg | ORAL_TABLET | ORAL | Status: AC
Start: 2023-12-23 — End: 2023-12-23
  Administered 2023-12-23: 1000 mg via ORAL

## 2023-12-23 MED ORDER — SODIUM CHLORIDE 0.9 % IR SOLN
Status: DC | PRN
  Administered 2023-12-23: 1000 mL via INTRAVESICAL
  Administered 2023-12-23: 1000 mL

## 2023-12-23 MED ORDER — FENTANYL CITRATE (PF) 250 MCG/5ML IJ SOLN
INTRAMUSCULAR | Status: AC
  Filled 2023-12-23: qty 5

## 2023-12-23 MED ORDER — 0.9 % SODIUM CHLORIDE (POUR BTL) OPTIME
TOPICAL | Status: DC | PRN
  Administered 2023-12-23: 1000 mL

## 2023-12-23 MED ORDER — ONDANSETRON HCL 4 MG/2ML IJ SOLN
INTRAMUSCULAR | Status: DC | PRN
  Administered 2023-12-23: 4 mg via INTRAVENOUS

## 2023-12-23 MED ORDER — BUPIVACAINE HCL (PF) 0.25 % IJ SOLN
INTRAMUSCULAR | Status: AC
  Filled 2023-12-23: qty 30

## 2023-12-23 SURGICAL SUPPLY — 79 items
APPLICATOR COTTON TIP 6 STRL (MISCELLANEOUS) IMPLANT
BAG URINE DRAIN 2000ML AR STRL (UROLOGICAL SUPPLIES) ×3 IMPLANT
BLADE SURG 15 STRL LF DISP TIS (BLADE) ×3 IMPLANT
CATH FOLEY 3WAY 5CC 16FR (CATHETERS) ×3 IMPLANT
CHLORAPREP W/TINT 26 (MISCELLANEOUS) ×3 IMPLANT
CNTNR URN SCR LID CUP LEK RST (MISCELLANEOUS) IMPLANT
COVER BACK TABLE 60X90IN (DRAPES) ×3 IMPLANT
COVER MAYO STAND STRL (DRAPES) ×3 IMPLANT
COVER TIP SHEARS 8 DVNC (MISCELLANEOUS) ×3 IMPLANT
DEFOGGER SCOPE WARM SEASHARP (MISCELLANEOUS) ×3 IMPLANT
DERMABOND ADVANCED .7 DNX12 (GAUZE/BANDAGES/DRESSINGS) ×3 IMPLANT
DRAPE ARM DVNC X/XI (DISPOSABLE) ×12 IMPLANT
DRAPE COLUMN DVNC XI (DISPOSABLE) ×3 IMPLANT
DRAPE SHEET LG 3/4 BI-LAMINATE (DRAPES) ×3 IMPLANT
DRAPE SURG IRRIG POUCH 19X23 (DRAPES) ×3 IMPLANT
DRAPE UTILITY XL STRL (DRAPES) ×3 IMPLANT
DRIVER NDL LRG 8 DVNC XI (INSTRUMENTS) IMPLANT
DRIVER NDL MEGA SUTCUT DVNCXI (INSTRUMENTS) IMPLANT
DRIVER NDLE LRG 8 DVNC XI (INSTRUMENTS) IMPLANT
DRIVER NDLE MEGA SUTCUT DVNCXI (INSTRUMENTS) ×3 IMPLANT
ELECTRODE REM PT RTRN 9FT ADLT (ELECTROSURGICAL) ×3 IMPLANT
FORCEPS BPLR 8 MD DVNC XI (FORCEP) IMPLANT
FORCEPS BPLR FENES DVNC XI (FORCEP) IMPLANT
GAUZE 4X4 16PLY ~~LOC~~+RFID DBL (SPONGE) IMPLANT
GLOVE BIOGEL PI IND STRL 6.5 (GLOVE) ×3 IMPLANT
GLOVE BIOGEL PI IND STRL 7.0 (GLOVE) ×6 IMPLANT
GLOVE ECLIPSE 6.0 STRL STRAW (GLOVE) ×9 IMPLANT
GLOVE SURG UNDER POLY LF SZ6.5 (GLOVE) ×12 IMPLANT
GOWN STRL REUS W/ TWL LRG LVL3 (GOWN DISPOSABLE) ×3 IMPLANT
GOWN STRL REUS W/TWL LRG LVL3 (GOWN DISPOSABLE) ×3 IMPLANT
GRASPER TIP-UP FEN DVNC XI (INSTRUMENTS) IMPLANT
HIBICLENS CHG 4% 4OZ (MISCELLANEOUS) ×3 IMPLANT
HIBICLENS CHG 4% 4OZ BTL (MISCELLANEOUS) ×6 IMPLANT
HOLDER FOLEY CATH W/STRAP (MISCELLANEOUS) ×3 IMPLANT
IRRIGATION SUCT STRKRFLW 2 WTP (MISCELLANEOUS) ×3 IMPLANT
IV NS 1000ML BAXH (IV SOLUTION) IMPLANT
KIT PINK PAD W/HEAD ARM REST (MISCELLANEOUS) ×3 IMPLANT
KIT TURNOVER KIT B (KITS) ×3 IMPLANT
LEGGING LITHOTOMY PAIR STRL (DRAPES) ×3 IMPLANT
MANIFOLD NEPTUNE II (INSTRUMENTS) ×3 IMPLANT
MANIPULATOR ADVINCU DEL 2.5 PL (MISCELLANEOUS) IMPLANT
MANIPULATOR ADVINCU DEL 3.0 PL (MISCELLANEOUS) IMPLANT
MANIPULATOR ADVINCU DEL 3.5 PL (MISCELLANEOUS) IMPLANT
MANIPULATOR ADVINCU DEL 4.0 PL (MISCELLANEOUS) IMPLANT
MESH VERTESSA LITE -Y 2X4X3 (Mesh General) ×3 IMPLANT
NDL HYPO 22X1.5 SAFETY MO (MISCELLANEOUS) ×3 IMPLANT
NDL INSUFFLATION 14GA 120MM (NEEDLE) ×3 IMPLANT
NEEDLE HYPO 22X1.5 SAFETY MO (MISCELLANEOUS) ×6 IMPLANT
NEEDLE INSUFFLATION 14GA 120MM (NEEDLE) ×3 IMPLANT
NS IRRIG 1000ML POUR BTL (IV SOLUTION) ×3 IMPLANT
OBTURATOR OPTICALSTD 8 DVNC (TROCAR) ×3 IMPLANT
PACK CYSTO (CUSTOM PROCEDURE TRAY) ×3 IMPLANT
PACK ROBOT WH (CUSTOM PROCEDURE TRAY) ×3 IMPLANT
PACK ROBOTIC GOWN (GOWN DISPOSABLE) ×3 IMPLANT
PACK VAGINAL WOMENS (CUSTOM PROCEDURE TRAY) ×3 IMPLANT
PAD OB MATERNITY 11 LF (PERSONAL CARE ITEMS) ×3 IMPLANT
PATTIES SURGICAL .5 X3 (DISPOSABLE) IMPLANT
SCISSORS MNPLR CVD DVNC XI (INSTRUMENTS) IMPLANT
SEAL UNIV 5-12 XI (MISCELLANEOUS) ×15 IMPLANT
SEALER VESSEL EXT DVNC XI (MISCELLANEOUS) IMPLANT
SET IRRIG Y TYPE TUR BLADDER L (SET/KITS/TRAYS/PACK) ×3 IMPLANT
SET TUBE SMOKE EVAC HIGH FLOW (TUBING) ×3 IMPLANT
SLEEVE SCD COMPRESS KNEE MED (STOCKING) ×3 IMPLANT
SPIKE FLUID TRANSFER (MISCELLANEOUS) ×3 IMPLANT
SUCTION TUBE FRAZIER 10FR DISP (SUCTIONS) ×3 IMPLANT
SUT GORETEX NAB #0 THX26 36IN (SUTURE) ×6 IMPLANT
SUT MNCRL AB 4-0 PS2 18 (SUTURE) ×6 IMPLANT
SUT MON AB 2-0 SH 27 (SUTURE) ×3 IMPLANT
SUT STRATA PDS 2-0 23 CT-1 (SUTURE) IMPLANT
SUT VIC AB 0 CT1 27XBRD ANBCTR (SUTURE) ×3 IMPLANT
SUT VIC AB 2-0 SH 27XBRD (SUTURE) ×3 IMPLANT
SUT VICRYL 2-0 SH 8X27 (SUTURE) IMPLANT
SUT VLOC 180 0 9IN GS21 (SUTURE) ×3 IMPLANT
SUTURE STRAT PDS 2-0 23 CT-2.5 (SUTURE) IMPLANT
SUTURE V-LC BRB 180 2/0GR9GS23 (SUTURE) ×6 IMPLANT
SYR BULB EAR ULCER 3OZ GRN STR (SYRINGE) ×3 IMPLANT
TOWEL GREEN STERILE (TOWEL DISPOSABLE) ×3 IMPLANT
TOWEL GREEN STERILE FF (TOWEL DISPOSABLE) ×3 IMPLANT
UNDERPAD 30X36 HEAVY ABSORB (UNDERPADS AND DIAPERS) ×3 IMPLANT

## 2023-12-23 NOTE — Interval H&P Note (Signed)
 History and Physical Interval Note:  12/23/2023 11:38 AM  Eileen Fleming  has presented today for surgery, with the diagnosis of incomplete uterovaginal prolapse.  The various methods of treatment have been discussed with the patient and family. After consideration of risks, benefits and other options for treatment, the patient has consented to  Procedure(s) with comments: HYSTERECTOMY, TOTAL, WITH BILATERAL SALPINGECTOMY, ROBOT-ASSISTED, WITH SACROCOLPOPEXY (N/A) - total time 3.5 hours PERINEOPLASTY (N/A) CYSTOSCOPY (N/A) as a surgical intervention.  The patient's history has been reviewed, patient examined, no change in status, stable for surgery.  I have reviewed the patient's chart and labs.  Questions were answered to the patient's satisfaction.     [name]

## 2023-12-23 NOTE — Discharge Instructions (Signed)

## 2023-12-23 NOTE — Anesthesia Postprocedure Evaluation (Signed)
 Anesthesia Post Note  Patient: Eileen Fleming  Procedure(s) Performed: HYSTERECTOMY, TOTAL, WITH BILATERAL SALPINGECTOMY, ROBOT-ASSISTED, WITH SACROCOLPOPEXY (Pelvis) PERINEOPLASTY (Perineum) CYSTOSCOPY (Bladder)     Patient location during evaluation: PACU Anesthesia Type: General Level of consciousness: awake and alert, oriented and patient cooperative Pain management: pain level controlled Vital Signs Assessment: post-procedure vital signs reviewed and stable Respiratory status: spontaneous breathing, nonlabored ventilation and respiratory function stable Cardiovascular status: blood pressure returned to baseline and stable Postop Assessment: no apparent nausea or vomiting Anesthetic complications: no   No notable events documented.  Last Vitals:  Vitals:   12/23/23 1645 12/23/23 1700  BP: 102/65 (!) 102/59  Pulse: 65 77  Resp: 12 13  Temp:  36.6 C  SpO2: 98% 97%    Last Pain:  Vitals:   12/23/23 1645  TempSrc:   PainSc: Asleep                 Judieth Mckown,E. Manolito Jurewicz

## 2023-12-23 NOTE — Op Note (Signed)
 Operative Note  Preoperative Diagnosis: anterior vaginal prolapse, posterior vaginal prolapse, and uterovaginal prolapse, incomplete  Postoperative Diagnosis: same  Procedures performed:  Robotic assisted total laparoscopic hysterectomy, bilateral salpingectomy, sacrocolpopexy [name]), cystoscopy, perineorrhaphy  Implants:  Implant Name Type Inv. Item Serial No. Manufacturer Lot No. LRB No. Used Action  MESH LAJUANDA MAJORIE SE [id number] Mesh General MESH LAJUANDA MAJORIE SE NITA  [facility] MEDICAL [phone] N/A 1 Implanted    Attending Surgeon: Rosaline Caper, MD  Assistant: Powell Hoops, RNFA  Anesthesia: General endotracheal  Findings: 1. On vaginal exam, stage III prolapse present  2. On cystoscopy, normal bladder and urethral mucosa without injury or lesion. Brisk bilateral ureteral efflux present.    3. On laparoscopy, no significant adhesions noted. Normal appearing uterus, bilateral fallopian tubes and ovaries.   Specimens:  ID Type Source Tests Collected by Time Destination  1 : uterus, cervix, bilateral fallopian tubes Tissue PATH Gyn benign resection SURGICAL PATHOLOGY Caper Rosaline SAILOR, MD 12/23/2023 1350     Estimated blood loss: 100 mL  IV fluids: 1200 mL  Urine output: 200 mL  Complications: none  Procedure in Detail:  After informed consent was obtained, the patient was taken to the operating room, where general anesthesia was induced and found to be adequate. She was placed in dorsolithotomy position in yellowfin stirrups. Her hips were noted not to be hyperflexed or hyperextended. Her arms were padded with gel pads and tucked to her sides. Her hands were surrounded by foam. A padded strap was placed across her chest with foam between the pad and her skin. She was noted to be appropriately positioned with all pressure points well padded and off tension. A tilt test showed no slippage. She was prepped and draped in the usual sterile  fashion. A uterine manipulator was placed in the uterus after sounding to 8 cm, an appropriately sized Koh ring was placed around the cervix, and a pneumo-occluder balloon was positioned in the vagina for later use.  A sterile Foley catheter was inserted.   0.25% plain Marcaine  was injected in the supraumbilical  area and an 8 mm supraumbilical skin incision was made with the scalpel.  A Veress needle was inserted into the incision, CO2 insufflation was started, a low opening pressure was noted, and pneumoperitoneum was obtained. The Veress needle was removed and a 8mm robotic trocar was placed. The robotic camera was inserted and intraperitoneal placement was confirmed. Survey of the abdomen and pelvis revealed the findings as noted above. The sacrum appeared to be free of any adhesive disease. After determining placement for the other ports, Local anesthetic was injected at each site and two 8 mm incisions were made for robotic ports at 10 cm lateral to and at the level of the umbilical port. Two additional 8 mm incisions were made 10 cm lateral to these and 30 degrees down followed by 8 mm robotic ports - the right side for an assistant port. All trocars were placed sequentially under direct visualization of the camera. The patient was placed in Trendelenburg. The robot was docked on the patient's right side. Monopolar endoshears alternating with a vessel sealer were placed in the right arm, a [place]  bipolar grasper was placed in the 2nd arm of the patient's left side, and a Tip up grasper was placed in the 1st arm on the patient's left side.   Attention was then turned to the sacral promontory. The peritoneum overlying the sacral promontory was tented up, dissected sharply with monopolar  scissors and electrosurgery using layer by layer technique. The peritoneal incision was extended down to the posterior cul-de-sac. This was performed with care to avoid the ureter on the right side and the sigmoid colon  and its mesentary on the left side. Two transverse sutures of CV2 Gortex were placed in the anterior longitudinal ligament.  Attention was then turned to the robotic hysterectomy. The ureter was identified and was found to be well away from the planned site of incision. The right mesosalpinx was cauterized and incised. The uteroovarian ligament was then cauterized and cut.  The right round ligament was grasped, cauterized, and transected with electrocautery. The anterior and posterior leaves of the broad ligament were taken down with cautery and sharp dissection. The uterine artery was skeletonized and the bladder flap was created on the right side with a combination of electrosurgery and sharp dissection. The KOH ring was identified. The right uterine artery was clamped, cauterized, and transected. In a similar fashion, the left side was taken down. Further sharp dissection with combination of cautery was performed to further develop the bladder flap. At this point, the KOH ring was completely hugging the cervix. The pneumo-occluder balloon in the vagina was inflated to maintain pneumoperitoneum. A colpotomy was performed with electrosurgical cutting current and the uterus and cervix were completely amputated from the vagina. The specimen was delivered through the vagina. The posterior portion of the vaginal cuff was then grasped and pulled up to maintain pneumoperitoneum. The pneumo-occluder balloon was then replaced in the vagina. The right hand instrument was changed to a suture-cut needle driver. The vaginal cuff was then closed using a 0 V-lock suture in two layers.    The right hand instrument was replaced with monopolar endoshears. With a lucite probe in the vagina, the anterior vaginal dissection was then performed with sharp dissection and electrosurgery. The posterior vaginal dissection was then performed with sharp dissection and electrosurgery in order to dissect the rectum away from the posterior  vagina.  A Y mesh was then inserted into the abdomen after trimming to appropriate size. With the probe in the vagina, the anterior leaf of the Y mesh was affixed to the anterior portion of the vagina using a 2-0 stratfix suture in a spiral pattern to distribute the suture evenly across the surface of the anterior mesh leaf. In a similar fashion, the posterior leaf of the Y mesh was attached to the posterior surface of the vagina with 2-0 stratafix suture.  The distal end of the mesh was then brought to overlie the sacrum. The correct amount of tension was determined in order to elevate the vagina, but not put the mesh under tension. The distal end of the mesh was then affixed to the anterior longitudinal sacral ligament using two interrupted transverse stitches of CV2 Gortex. The excess distal mesh was then cut and removed. The peritoneum was reapproximated over the mesh using 2-0 monocryl. The bladder flap was incorporated to completely retroperitonealize the mesh. All pedicles were carefully inspected and noted to be hemostatic as the CO2 gas was deflated. All instruments were removed from the patient's abdomen.   The Foley catheter was removed.  A 70-degree cystoscope was introduced, and 360-degree inspection revealed no injury, lesion or foreign body in the bladder. Brisk bilateral ureteral efflux was noted with the assistance of pyridium .  The bladder was drained and the cystoscope was removed.  The Foley catheter was replaced.  The robot was undocked. The CO2 gas was removed and the ports  were removed.  The skin incisions were closed with subcutaneous stitches of 4-0 Monocryl and covered with skin glue.    Attention was then turned to the perineum. Two allis clamps were placed at the introitus. The perineum was injected with 1% lidocaine  with epinephrine . A diamond shaped incision was made over the perineum and excess skin was removed. Dissection was performed with Metzenbaum scissors to separate the  mucosa from the underlying tissue. The perineal body was then reapproximated with two interrupted 0-vicryl sutures. The perineal skin was then closed with a 2-0 vicryl in a subcutaneous and subcuticular fashion. Irrigation was performed and good hemostasis was noted. Rectal exam was performed to ensure no sutures in the rectum. The patient tolerated the procedure well. She was awakened from anesthesia and transferred to the recovery room in stable condition. Sponge, lap, and needle counts were correct x 2.   [name], MD

## 2023-12-23 NOTE — Anesthesia Preprocedure Evaluation (Addendum)
 Anesthesia Evaluation  Patient identified by MRN, date of birth, ID band Patient awake    Reviewed: Allergy & Precautions, H&P , NPO status , Patient's Chart, lab work & pertinent test results  Airway Mallampati: II  TM Distance: >3 FB Neck ROM: Full    Dental  (+) Dental Advisory Given   Pulmonary neg pulmonary ROS   Pulmonary exam normal breath sounds clear to auscultation       Cardiovascular negative cardio ROS Normal cardiovascular exam Rhythm:Regular Rate:Normal     Neuro/Psych   Anxiety Depression    negative neurological ROS  negative psych ROS   GI/Hepatic negative GI ROS, Neg liver ROS,,,  Endo/Other  negative endocrine ROS    Renal/GU negative Renal ROS  negative genitourinary   Musculoskeletal negative musculoskeletal ROS (+)    Abdominal   Peds negative pediatric ROS (+)  Hematology negative hematology ROS (+)   Anesthesia Other Findings   Reproductive/Obstetrics negative OB ROS                              Anesthesia Physical Anesthesia Plan  ASA: 2  Anesthesia Plan: General   Post-op Pain Management: Tylenol  PO (pre-op)* and Gabapentin  PO (pre-op)*   Induction: Intravenous  PONV Risk Score and Plan: 3 and Ondansetron , Dexamethasone , Midazolam  and Treatment may vary due to age or medical condition  Airway Management Planned: Oral ETT  Additional Equipment:   Intra-op Plan:   Post-operative Plan: Extubation in OR  Informed Consent: I have reviewed the patients History and Physical, chart, labs and discussed the procedure including the risks, benefits and alternatives for the proposed anesthesia with the patient or authorized representative who has indicated his/her understanding and acceptance.     Dental advisory given  Plan Discussed with: CRNA  Anesthesia Plan Comments:         Anesthesia Quick Evaluation

## 2023-12-23 NOTE — Anesthesia Procedure Notes (Signed)
 Procedure Name: Intubation Date/Time: 12/23/2023 12:28 PM  Performed by: Gratia Disla C, CRNAPre-anesthesia Checklist: Patient identified, Emergency Drugs available, Suction available and Patient being monitored Patient Re-evaluated:Patient Re-evaluated prior to induction Oxygen Delivery Method: Circle system utilized Preoxygenation: Pre-oxygenation with 100% oxygen Induction Type: IV induction Ventilation: Mask ventilation without difficulty Laryngoscope Size: Mac and 3 Grade View: Grade I Tube type: Oral Tube size: 7.0 mm Number of attempts: 1 Airway Equipment and Method: Stylet and Oral airway Placement Confirmation: ETT inserted through vocal cords under direct vision, positive ETCO2 and breath sounds checked- equal and bilateral Secured at: 21 cm Tube secured with: Tape Dental Injury: Teeth and Oropharynx as per pre-operative assessment

## 2023-12-23 NOTE — Transfer of Care (Signed)
 Immediate Anesthesia Transfer of Care Note  Patient: Eileen Fleming  Procedure(s) Performed: HYSTERECTOMY, TOTAL, WITH BILATERAL SALPINGECTOMY, ROBOT-ASSISTED, WITH SACROCOLPOPEXY (Pelvis) PERINEOPLASTY (Perineum) CYSTOSCOPY (Bladder)  Patient Location: PACU  Anesthesia Type:General  Level of Consciousness: awake, alert , and oriented  Airway & Oxygen Therapy: Patient Spontanous Breathing  Post-op Assessment: Report given to RN and Post -op Vital signs reviewed and stable  Post vital signs: Reviewed and stable  Last Vitals:  Vitals Value Taken Time  BP 102/56 12/23/23 15:45  Temp 36.6 C 12/23/23 15:45  Pulse 75 12/23/23 15:52  Resp 8 12/23/23 15:52  SpO2 98 % 12/23/23 15:52  Vitals shown include unfiled device data.  Last Pain:  Vitals:   12/23/23 0954  TempSrc: Oral  PainSc: 0-No pain      Patients Stated Pain Goal: 5 (12/23/23 1545)  Complications: No notable events documented.

## 2023-12-24 ENCOUNTER — Encounter (HOSPITAL_COMMUNITY): Payer: Self-pay | Admitting: Obstetrics and Gynecology

## 2023-12-24 ENCOUNTER — Telehealth: Payer: Self-pay | Admitting: Obstetrics and Gynecology

## 2023-12-24 NOTE — Telephone Encounter (Signed)
 Eileen Fleming underwent Robotic assisted total laparoscopic hysterectomy, bilateral salpingectomy, sacrocolpopexy [name]), cystoscopy, perineorrhaphy on 12/23/23.   She passed her voiding trial.  was backfilled into the bladder Voided - possible missed some in hat PVR by bladder scan was 60ml.   She was discharged without a catheter. Please call her for a routine post op check. Thanks!  [name], MD

## 2023-12-25 LAB — SURGICAL PATHOLOGY

## 2024-02-03 ENCOUNTER — Ambulatory Visit (INDEPENDENT_AMBULATORY_CARE_PROVIDER_SITE_OTHER): Admitting: Obstetrics and Gynecology

## 2024-02-03 ENCOUNTER — Encounter: Payer: Self-pay | Admitting: Obstetrics and Gynecology

## 2024-02-03 VITALS — BP 98/71 | HR 79

## 2024-02-03 DIAGNOSIS — Z48816 Encounter for surgical aftercare following surgery on the genitourinary system: Secondary | ICD-10-CM

## 2024-02-03 DIAGNOSIS — Z9889 Other specified postprocedural states: Secondary | ICD-10-CM

## 2024-02-03 DIAGNOSIS — N393 Stress incontinence (female) (male): Secondary | ICD-10-CM

## 2024-02-03 NOTE — Progress Notes (Signed)
 [facility]  Date of Visit: 02/03/2024  History of Present Illness: Eileen Fleming is a 53 y.o. female scheduled today for a post-operative visit.   Surgery: s/p Robotic assisted total laparoscopic hysterectomy, bilateral salpingectomy, sacrocolpopexy Eileen Fleming Y), cystoscopy, perineorrhaphy on 12/23/23  She passed her postoperative void trial.   Postoperative course has been uncomplicated.   She has been feeling really well and is happy with the surgery. Has needed minimal pain medication.   UTI in the last 6 weeks? No  Pain? No  She has returned to her normal activity (except for postop restrictions) Vaginal bulge? No  Stress incontinence: has some with walking in the evening, but only occasionally Urgency/frequency: No  Urge incontinence: No  Voiding dysfunction: No  Bowel issues: No   Subjective Success: Do you usually have a bulge or something falling out that you can see or feel in the vaginal area? No  Retreatment Success: Any retreatment with surgery or pessary for any compartment? No   Pathology results: UTERUS AND CERVIX, WITH BILATERAL FALLOPIAN TUBES, HYSTERECTOMY:  - Proliferative endometrium.  - Myometrium with no specific pathologic change.  - Benign cervix with squamous metaplasia and parakeratosis.  - Fallopian tubes with benign paratubal cysts.   Medications: She has a current medication list which includes the following prescription(s): glycerin and diphenhydramine-zinc acetate.   Allergies: Patient is allergic to morphine and codeine.   Physical Exam: BP 98/71   Pulse 79   LMP 12/07/2023 (Exact Date)   Abdomen: soft, non-tender, without masses or organomegaly Laparoscopic Incisions: healing well.  Pelvic Examination: Vagina: Incisions healing well. Sutures are present at the cuff and there is not granulation tissue. No tenderness along the anterior or posterior vagina. No apical tenderness. No pelvic masses. No visible or palpable  mesh.  POP-Q: POP-Q  -3                                            Aa   -3                                           Ba  -8                                              C   4                                            Gh  6                                            Pb  8                                            tvl   -2  Ap  -2                                            Bp                                                 D    ---------------------------------------------------------  Assessment and Plan:  1. Post-operative state   2. SUI (stress urinary incontinence, female)     - Pathology results were reviewed and are benign.  - Can resume regular activity including exercise. Discussed avoidance of heavy lifting and straining long term to reduce the risk of recurrence. Wait an additional 6 weeks for intercourse.  - She is not that bothered by SUI and prefers not to intervene right now. We discussed the option of pelvic PT and she would like a referral.   All questions answered.   Return in about 1 year (around 02/02/2025).  [name], MD

## 2024-02-05 ENCOUNTER — Encounter: Payer: Self-pay | Admitting: Physical Therapy

## 2024-02-05 ENCOUNTER — Ambulatory Visit: Attending: Obstetrics and Gynecology | Admitting: Physical Therapy

## 2024-02-05 ENCOUNTER — Other Ambulatory Visit: Payer: Self-pay

## 2024-02-05 DIAGNOSIS — R279 Unspecified lack of coordination: Secondary | ICD-10-CM | POA: Insufficient documentation

## 2024-02-05 DIAGNOSIS — R293 Abnormal posture: Secondary | ICD-10-CM | POA: Diagnosis present

## 2024-02-05 DIAGNOSIS — M6281 Muscle weakness (generalized): Secondary | ICD-10-CM | POA: Diagnosis present

## 2024-02-05 NOTE — Therapy (Signed)
 OUTPATIENT PHYSICAL THERAPY FEMALE PELVIC EVALUATION   Patient Name: Eileen Fleming MRN: [id number] DOB:16-Jun-1970, 53 y.o., female Today's Date: 02/05/2024  END OF SESSION:  PT End of Session - 02/05/24 0954     Visit Number 1    Number of Visits 4    Date for Recertification  08/05/24    Authorization Type BC[employer]    PT Start Time [phone]    PT Stop Time 1005    PT Time Calculation (min) 40 min    Activity Tolerance Patient tolerated treatment well    Behavior During Therapy [facility] for tasks assessed/performed          Past Medical History:  Diagnosis Date   Depression with anxiety    FHx: hypertension    History of palpitations in adulthood 2009   evaluation w/ work-up by dr [place] in 2009 w/ hx syncope felt to be vasovagal episode  --  normal Holter monitor & negative echo ;   recurrent palpitaions seen by Eileen Martins NP 05-06-2022 24 hr Zio done rare PVCs/ PACs follow up w/ PCP   Incomplete emptying of bladder    Poison ivy    pt stated thursday 12-11-2023 working in the yard , localed on back   SUI (stress urinary incontinence, female)    Uterovaginal prolapse, incomplete    Wears glasses    Past Surgical History:  Procedure Laterality Date   COLONOSCOPY WITH PROPOFOL   01/03/2023   [facility]-- dr rosalie   CYSTOSCOPY N/A 12/23/2023   Procedure: CYSTOSCOPY;  Surgeon: Marilynne Rosaline SAILOR, MD;  Location: [facility] OR;  Service: Gynecology;  Laterality: N/A;   HAND TENDON SURGERY Right 03/23/1999   @[facility]  by dr christella. sissy;   repair right little finger tendon and laceration   PERINEOPLASTY N/A 12/23/2023   Procedure: PERINEOPLASTY;  Surgeon: Marilynne Rosaline SAILOR, MD;  Location: [facility] OR;  Service: Gynecology;  Laterality: N/A;   URETHRA SURGERY  1974   as an infant to allow better urination  (Urethral dilataion)   XI ROBOTIC ASSISTED TOTAL HYSTERECTOMY WITH SACROCOLPOPEXY N/A 12/23/2023   Procedure: HYSTERECTOMY, TOTAL, WITH BILATERAL SALPINGECTOMY, ROBOT-ASSISTED, WITH SACROCOLPOPEXY;   Surgeon: Marilynne Rosaline SAILOR, MD;  Location: [facility] OR;  Service: Gynecology;  Laterality: N/A;  total time 3.5 hours   Patient Active Problem List   Diagnosis Date Noted   Uterovaginal prolapse, incomplete 08/15/2023   SUI (stress urinary incontinence, female) 08/15/2023   Palpitation 05/06/2012    PCP: Debrah Josette ORN., PA-C   REFERRING PROVIDER: Marilynne Rosaline SAILOR, MD   REFERRING DIAG: N39.3 (ICD-10-CM) - SUI (stress urinary incontinence, female)  THERAPY DIAG:  Muscle weakness (generalized)  Unspecified lack of coordination  Abnormal posture  Rationale for Evaluation and Treatment: Rehabilitation  ONSET DATE: 12/23/23  SUBJECTIVE:  SUBJECTIVE STATEMENT: Eval: Pt reports to PFPT s/p Robotic assisted total laparoscopic hysterectomy, bilateral salpingectomy, sacrocolpopexy [name]), cystoscopy, perineorrhaphy on 12/23/23. She has been walking 3 miles at a time. She has been doing well otherwise, sometimes she will dribble urine while she is walking for exercise. This will happen randomly, even without a full bladder.  Fluid intake: 2-3 30-40 oz water bottles per day; 2 8oz cups daily; hot tea 1x/day 8 oz   FUNCTIONAL LIMITATIONS: leaking small amounts of urine while walking   PERTINENT HISTORY:  Medications for current condition: no medications  Surgeries: s/p Robotic assisted total laparoscopic hysterectomy, bilateral salpingectomy, sacrocolpopexy [name]), cystoscopy, perineorrhaphy on 12/23/23 Other: no other surgical history  Sexual abuse: No  PAIN:  Are you having pain? No NPRS scale: 0/10  PRECAUTIONS: None  RED FLAGS: None   WEIGHT BEARING RESTRICTIONS: No  FALLS:  Has patient fallen in last 6 months? No  OCCUPATION: works Engineer, structural of  day   ACTIVITY LEVEL : walks for exercise and does pelvic floor videos   PLOF: Independent with basic ADLs  PATIENT GOALS:  To not leak urine when walking, to get a little stronger   BOWEL MOVEMENT:  Pain with bowel movement: No Type of bowel movement:Type (Bristol Stool Scale) 4, Frequency within normal limits , Strain no, and Splinting no Fully empty rectum: Yes:   Leakage: No                                                     Caused by:  Pads: No Fiber supplement/laxative No  URINATION:  Pain with urination: No Fully empty bladder: Yes:                                  Post-void dribble: No Stream: Strong Urgency: Yes  Frequency:during the day within normal limits                                                          Nocturia: No   Leakage: Walking to the bathroom and Exercise Pads/briefs: No  INTERCOURSE: Pt is not currently sexually active. She has 6 more weeks of restriction   PREGNANCY: Vaginal deliveries 2 Tearing Yes: maybe 1 stitch with first  Episiotomy No C-section deliveries 0 Currently pregnant No  PROLAPSE: None  OBJECTIVE:  Note: Objective measures were completed at Evaluation unless otherwise noted.  PATIENT SURVEYS:  PFIQ-7: 11  COGNITION: Overall cognitive status: Within functional limits for tasks assessed     SENSATION: Light touch: Appears intact  LUMBAR SPECIAL TESTS:  Straight leg raise test: Positive  FUNCTIONAL TESTS:  Single leg stance:  Rt: +  Lt: +  Sit-up test: 1/3  Squat: within normal limits  Bed mobility: within functional limits   GAIT: Assistive device utilized: None Comments: mild trendelenburg gait pattern with ambulation  POSTURE: rounded shoulders and forward head  LUMBARAROM/PROM: within normal limits for all motions bilaterally with no pain  LOWER EXTREMITY ROM: within functional limits for all motions bilaterally   LOWER EXTREMITY MMT: 4/5 bilateral  knees and hips grossly   PALPATION:  General: no  tenderness to palpation of bilateral hip flexors or adductors in supine   Pelvic Alignment: within normal limits   Abdominal: abdominal bracing at rest  Diastasis: No Distortion: No  Breathing: apical breathing pattern with decreased lower rib excursion with inhalation  Scar tissue: Yes:                  External Perineal Exam: moisture levels are intact with sufficient clitoral hood mobility                              Internal Pelvic Floor: Patient fully consents to today's internal examination. She demonstrates normal tone in both superficial and deep aspects of pelvic floor musculature with no palpable trigger points. She has a weakened pelvic floor contraction that improves when paired with exhalation, and there is a lack of coordination between the diaphragm and pelvic floor musculature. She had no pain during or following today's examination.   Patient confirms identification and approves PT to assess internal pelvic floor and treatment Yes No emotional/communication barriers or cognitive limitation. Patient is motivated to learn. Patient understands and agrees with treatment goals and plan. PT explains patient will be examined in standing, sitting, and lying down to see how their muscles and joints work. When they are ready, they will be asked to remove their underwear so PT can examine their perineum. The patient is also given the option of providing their own chaperone as one is not provided in our facility. The patient also has the right and is explained the right to defer or refuse any part of the evaluation or treatment including the internal exam. With the patient's consent, PT will use one gloved finger to gently assess the muscles of the pelvic floor, seeing how well it contracts and relaxes and if there is muscle symmetry. After, the patient will get dressed and PT and patient will discuss exam findings and plan of care. PT and patient discuss plan of care, schedule, attendance  policy and HEP activities.  PELVIC MMT:   MMT eval  Vaginal 3/5, 6 quick flicks   Internal Anal Sphincter   External Anal Sphincter   Puborectalis   Diastasis Recti   (Blank rows = not tested)       TONE: Within normal limits in bilateral aspects of superficial and deep pelvic floor musculature   PROLAPSE: None present in hooklying   TODAY'S TREATMENT:                                                                                                                              DATE:   EVAL 02/05/24: Examination completed, findings reviewed, pt educated on POC, HEP, and self care. Pt motivated to participate in PT and agreeable to attempt recommendations.   Hooklying diaphragmatic breathing + pelvic floor contraction 2x10  Hooklying quick flick contractions +  diaphragmatic breathing 2x10   PATIENT EDUCATION:  Education details: relative anatomy and the connection between the diaphragm and pelvic floor, water intake vs bladder irritants  Person educated: Patient Education method: Explanation, Demonstration, Tactile cues, Verbal cues, and Handouts Education comprehension: verbalized understanding, returned demonstration, verbal cues required, tactile cues required, and needs further education  HOME EXERCISE PROGRAM: Access Code: [email or web address] URL: [email or web address] Date: 02/05/2024 Prepared by: Celena Domino  Exercises - Supine Pelvic Floor Contraction  - 1 x daily - 7 x weekly - 3 sets - 10 reps - Quick Flick Pelvic Floor Contractions in Hooklying  - 1 x daily - 7 x weekly - 3 sets - 10 reps  Patient Education - Get To Know Your Pelvic Floor- Female  ASSESSMENT:  CLINICAL IMPRESSION: Patient is a 53 y.o. female  who was seen today for physical therapy evaluation and treatment for stress urinary incontinence following hysterectomy 12/23/2023. Since her surgery, patient has been recovering with no issues, no pelvic pain, no bowel concerns. She is only leaking randomly  with walking for exercise and it is small in amount. She has not returned to intercourse but plans to after 12 week restriction is lifted. Patient fully consents to today's internal examination. She demonstrates normal tone in both superficial and deep aspects of pelvic floor musculature with no palpable trigger points. She has a weakened pelvic floor contraction that improves when paired with exhalation, and there is a lack of coordination between the diaphragm and pelvic floor musculature. She had no pain during or following today's examination. Overall, pt tolerated session well and Pt would benefit from additional PT to further address deficits.    OBJECTIVE IMPAIRMENTS: decreased coordination, decreased endurance, decreased mobility, decreased ROM, and decreased strength.   ACTIVITY LIMITATIONS: continence and toileting  PARTICIPATION LIMITATIONS: community activity  PERSONAL FACTORS: Age, Past/current experiences, and Time since onset of injury/illness/exacerbation are also affecting patient's functional outcome.   REHAB POTENTIAL: Good  CLINICAL DECISION MAKING: Stable/uncomplicated  EVALUATION COMPLEXITY: Low   GOALS: Goals reviewed with patient? Yes  SHORT TERM GOALS: Target date: 03/04/2024  Pt will be independent with HEP.  Baseline: Goal status: INITIAL  2.  Pt will be independent with use of squatty potty, relaxed toileting mechanics, and improved bowel movement techniques in order to increase ease of bowel movements and complete evacuation.  Baseline:  Goal status: INITIAL  3.  Pt will be independent with the knack, urge suppression technique, and double voiding in order to improve bladder habits and decrease urinary incontinence.  Baseline:  Goal status: INITIAL  LONG TERM GOALS: Target date: 08/05/2024  Pt will be independent with advanced HEP.  Baseline:  Goal status: INITIAL  2.  Pt to demonstrate improved coordination of pelvic floor and breathing mechanics  with 10# squat with appropriate synergistic patterns to decrease pain and leakage at least 75% of the time for improved ability to complete a 30 minute workout with strain at pelvic floor and symptoms.   Baseline:  Goal status: INITIAL  3.  Pt will demonstrate 3/5 pelvic floor muscle strength with appropriate coordination in order to decrease urinary incontinence, urgency and frequency.   Baseline:  Goal status: INITIAL  PLAN:  PT FREQUENCY: 1-2x/week  PT DURATION: 6 months  PLANNED INTERVENTIONS: 97110-Therapeutic exercises, 97530- Therapeutic activity, 97112- Neuromuscular re-education, 97535- Self Care, [id number]- Manual therapy, Patient/Family education, Taping, Joint mobilization, Spinal mobilization, Scar mobilization, Cryotherapy, Moist heat, and Biofeedback  PLAN FOR NEXT SESSION: continued pelvic floor AROM in seated, introduce toileting mechanics  and urge/knack drill, hip/glute/core strengthening, stretching to back and hips   Celena JAYSON Domino, PT 02/05/2024, 10:04 AM [facility] [address] [place], [place] [address] Phone # [phone] Fax [phone]

## 2024-03-01 ENCOUNTER — Ambulatory Visit: Attending: Obstetrics and Gynecology | Admitting: Physical Therapy

## 2024-03-01 DIAGNOSIS — M6281 Muscle weakness (generalized): Secondary | ICD-10-CM | POA: Insufficient documentation

## 2024-03-01 DIAGNOSIS — R279 Unspecified lack of coordination: Secondary | ICD-10-CM | POA: Insufficient documentation

## 2024-03-01 NOTE — Therapy (Signed)
 OUTPATIENT PHYSICAL THERAPY FEMALE PELVIC TREATMENT   Patient Name: Nykerria Macconnell MRN: [id number] DOB:August 04, 1970, 53 y.o., female Today's Date: 03/01/2024  END OF SESSION:  PT End of Session - 03/01/24 1325     Visit Number 2    Number of Visits 4    Date for Recertification  08/05/24    Authorization Type BCBS    PT Start Time 1230    PT Stop Time 1315    PT Time Calculation (min) 45 min    Activity Tolerance Patient tolerated treatment well    Behavior During Therapy WFL for tasks assessed/performed           Past Medical History:  Diagnosis Date   Depression with anxiety    FHx: hypertension    History of palpitations in adulthood 2009   evaluation w/ work-up by dr jordan in 2009 w/ hx syncope felt to be vasovagal episode  --  normal Holter monitor & negative echo ;   recurrent palpitaions seen by Katheryn Martins NP 05-06-2022 24 hr Zio done rare PVCs/ PACs follow up w/ PCP   Incomplete emptying of bladder    Poison ivy    pt stated thursday 12-11-2023 working in the yard , localed on back   SUI (stress urinary incontinence, female)    Uterovaginal prolapse, incomplete    Wears glasses    Past Surgical History:  Procedure Laterality Date   COLONOSCOPY WITH PROPOFOL   01/03/2023   [facility]-- dr rosalie   CYSTOSCOPY N/A 12/23/2023   Procedure: CYSTOSCOPY;  Surgeon: Marilynne Rosaline SAILOR, MD;  Location: [facility] OR;  Service: Gynecology;  Laterality: N/A;   HAND TENDON SURGERY Right 03/23/1999   @[facility]  by dr christella. sissy;   repair right little finger tendon and laceration   PERINEOPLASTY N/A 12/23/2023   Procedure: PERINEOPLASTY;  Surgeon: Marilynne Rosaline SAILOR, MD;  Location: [facility] OR;  Service: Gynecology;  Laterality: N/A;   URETHRA SURGERY  1974   as an infant to allow better urination  (Urethral dilataion)   XI ROBOTIC ASSISTED TOTAL HYSTERECTOMY WITH SACROCOLPOPEXY N/A 12/23/2023   Procedure: HYSTERECTOMY, TOTAL, WITH BILATERAL SALPINGECTOMY, ROBOT-ASSISTED, WITH SACROCOLPOPEXY;   Surgeon: Marilynne Rosaline SAILOR, MD;  Location: [facility] OR;  Service: Gynecology;  Laterality: N/A;  total time 3.5 hours   Patient Active Problem List   Diagnosis Date Noted   Uterovaginal prolapse, incomplete 08/15/2023   SUI (stress urinary incontinence, female) 08/15/2023   Palpitation 05/06/2012    PCP: Debrah Josette ORN., PA-C   REFERRING PROVIDER: Marilynne Rosaline SAILOR, MD   REFERRING DIAG: N39.3 (ICD-10-CM) - SUI (stress urinary incontinence, female)  THERAPY DIAG:  Muscle weakness (generalized)  Unspecified lack of coordination  Rationale for Evaluation and Treatment: Rehabilitation  ONSET DATE: 12/23/23  SUBJECTIVE:  SUBJECTIVE STATEMENT: Patient reports that she has been dribbling more urine than she used to in the past. She can pass gas and this will cause her to dribble. She is frustrated that her leakage has not gotten any better. She leaks occasionally with coughing/laughing/sneezing. She has been doing some strengthening and low impact cardio videos at home.   Eval: Pt reports to PFPT s/p Robotic assisted total laparoscopic hysterectomy, bilateral salpingectomy, sacrocolpopexy [name]), cystoscopy, perineorrhaphy on 12/23/23. She has been walking 3 miles at a time. She has been doing well otherwise, sometimes she will dribble urine while she is walking for exercise. This will happen randomly, even without a full bladder.  Fluid intake: 2-3 30-40 oz water bottles per day; 2 8oz cups daily; hot tea 1x/day 8 oz   FUNCTIONAL LIMITATIONS: leaking small amounts of urine while walking   PERTINENT HISTORY:  Medications for current condition: no medications  Surgeries: s/p Robotic assisted total laparoscopic hysterectomy, bilateral salpingectomy, sacrocolpopexy [name]), cystoscopy,  perineorrhaphy on 12/23/23 Other: no other surgical history  Sexual abuse: No  PAIN:  Are you having pain? No NPRS scale: 0/10  PRECAUTIONS: None  RED FLAGS: None   WEIGHT BEARING RESTRICTIONS: No  FALLS:  Has patient fallen in last 6 months? No  OCCUPATION: works engineer, structural of day   ACTIVITY LEVEL : walks for exercise and does pelvic floor videos   PLOF: Independent with basic ADLs  PATIENT GOALS:  To not leak urine when walking, to get a little stronger   BOWEL MOVEMENT:  Pain with bowel movement: No Type of bowel movement:Type (Bristol Stool Scale) 4, Frequency within normal limits , Strain no, and Splinting no Fully empty rectum: Yes:   Leakage: No                                                     Caused by:  Pads: No Fiber supplement/laxative No  URINATION:  Pain with urination: No Fully empty bladder: Yes:                                  Post-void dribble: No Stream: Strong Urgency: Yes  Frequency:during the day within normal limits                                                          Nocturia: No   Leakage: Walking to the bathroom and Exercise Pads/briefs: No  INTERCOURSE: Pt is not currently sexually active. She has 6 more weeks of restriction   PREGNANCY: Vaginal deliveries 2 Tearing Yes: maybe 1 stitch with first  Episiotomy No C-section deliveries 0 Currently pregnant No  PROLAPSE: None  OBJECTIVE:  Note: Objective measures were completed at Evaluation unless otherwise noted.  PATIENT SURVEYS:  PFIQ-7: 11  COGNITION: Overall cognitive status: Within functional limits for tasks assessed     SENSATION: Light touch: Appears intact  LUMBAR SPECIAL TESTS:  Straight leg raise test: Positive  FUNCTIONAL TESTS:  Single leg stance:  Rt: +  Lt: +  Sit-up test: 1/3  Squat: within normal  limits  Bed mobility: within functional limits   GAIT: Assistive device utilized: None Comments: mild trendelenburg gait  pattern with ambulation  POSTURE: rounded shoulders and forward head  LUMBARAROM/PROM: within normal limits for all motions bilaterally with no pain  LOWER EXTREMITY ROM: within functional limits for all motions bilaterally   LOWER EXTREMITY MMT: 4/5 bilateral knees and hips grossly   PALPATION:  General: no tenderness to palpation of bilateral hip flexors or adductors in supine   Pelvic Alignment: within normal limits   Abdominal: abdominal bracing at rest  Diastasis: No Distortion: No  Breathing: apical breathing pattern with decreased lower rib excursion with inhalation  Scar tissue: Yes:                  External Perineal Exam: moisture levels are intact with sufficient clitoral hood mobility                              Internal Pelvic Floor: Patient fully consents to today's internal examination. She demonstrates normal tone in both superficial and deep aspects of pelvic floor musculature with no palpable trigger points. She has a weakened pelvic floor contraction that improves when paired with exhalation, and there is a lack of coordination between the diaphragm and pelvic floor musculature. She had no pain during or following today's examination.   Patient confirms identification and approves PT to assess internal pelvic floor and treatment Yes No emotional/communication barriers or cognitive limitation. Patient is motivated to learn. Patient understands and agrees with treatment goals and plan. PT explains patient will be examined in standing, sitting, and lying down to see how their muscles and joints work. When they are ready, they will be asked to remove their underwear so PT can examine their perineum. The patient is also given the option of providing their own chaperone as one is not provided in our facility. The patient also has the right and is explained the right to defer or refuse any part of the evaluation or treatment including the internal exam. With the patient's  consent, PT will use one gloved finger to gently assess the muscles of the pelvic floor, seeing how well it contracts and relaxes and if there is muscle symmetry. After, the patient will get dressed and PT and patient will discuss exam findings and plan of care. PT and patient discuss plan of care, schedule, attendance policy and HEP activities.  PELVIC MMT:   MMT eval  Vaginal 3/5, 6 quick flicks   Internal Anal Sphincter   External Anal Sphincter   Puborectalis   Diastasis Recti   (Blank rows = not tested)       TONE: Within normal limits in bilateral aspects of superficial and deep pelvic floor musculature   PROLAPSE: None present in hooklying   TODAY'S TREATMENT:  DATE:   EVAL 02/05/24: Examination completed, findings reviewed, pt educated on POC, HEP, and self care. Pt motivated to participate in PT and agreeable to attempt recommendations.   Hooklying diaphragmatic breathing + pelvic floor contraction 2x10  Hooklying quick flick contractions + diaphragmatic breathing 2x10   03/01/24:  Internal treatment for review of HEP: Patient confirms identification and approves physical therapist to perform internal soft tissue work   Seated pelvic floor contraction + diaphragmatic breathing 2x10  Seated quick flick pelvic floor contraction + diaphragmatic breathing 2x10  Sidelying clamshell + reverse clamshell + diaphragmatic breathing 2x10  Bridge + adductor ball squeeze + diaphragmatic breathing 2x10  Mini squat + pelvic floor contraction + diaphragmatic breathing 2x10  Standing hamstring stretch + diaphragmatic breathing 2x59min  Piriformis stretch + diaphragmatic breathing 2x17min  Supine butterfly stretch + diaphragmatic breathing 2x32min  Blow as you go technique for lifting boxes at work  [employer] intake recommendation: drinking water first thing in the morning  before coffee to limit irritant effect   PATIENT EDUCATION:  Education details: relative anatomy and the connection between the diaphragm and pelvic floor, water intake vs bladder irritants  Person educated: Patient Education method: Programmer, Multimedia, Demonstration, Tactile cues, Verbal cues, and Handouts Education comprehension: verbalized understanding, returned demonstration, verbal cues required, tactile cues required, and needs further education  HOME EXERCISE PROGRAM: Access Code: [email or web address] URL: [email or web address] Date: 03/01/2024 Prepared by: Celena Domino  Exercises - Seated Pelvic Floor Contraction  - 1 x daily - 7 x weekly - 2 sets - 10 reps - Seated Quick Flick Pelvic Floor Contractions  - 1 x daily - 7 x weekly - 2 sets - 10 reps - Supine Bridge with Mini Swiss Ball Between Knees  - 1 x daily - 7 x weekly - 2 sets - 10 reps - Clamshell  - 1 x daily - 7 x weekly - 2 sets - 10 reps - Sidelying Reverse Clamshell  - 1 x daily - 7 x weekly - 2 sets - 10 reps - Mini Squat with Pelvic Floor Contraction  - 1 x daily - 7 x weekly - 2 sets - 10 reps - Supine Butterfly Groin Stretch  - 1 x daily - 7 x weekly - 2 sets - hold - Standing Hamstring Stretch with Step  - 1 x daily - 7 x weekly - 2 sets - hold - Supine Figure 4 Piriformis Stretch  - 1 x daily - 7 x weekly - 2 sets - hold  Patient Education - Get To Know Your Pelvic Floor- Female  ASSESSMENT:  CLINICAL IMPRESSION: Patient is a 53 y.o. female  who was seen today for physical therapy treatment for stress urinary incontinence following hysterectomy 12/23/2023. Since evaluation, pt has been consistent with HEP but is still leaking, and feels like it might be getting worse. We reviewed pelvic floor contractions paired with diaphragmatic breathing in supine today with pt's consent. She was able to complete this coordination in the seated position also, so we added seated pelvic floor contractions to HEP to see  if additional gravitational load helps with pt's leakage. She tolerated gluteal and hip strengthening introduction very well and had no pain with these. Overall, pt tolerated session well and Pt would benefit from additional PT to further address deficits.    OBJECTIVE IMPAIRMENTS: decreased coordination, decreased endurance, decreased mobility, decreased ROM, and decreased strength.   ACTIVITY LIMITATIONS: continence and toileting  PARTICIPATION LIMITATIONS: community activity  PERSONAL FACTORS: Age, Past/current experiences,  and Time since onset of injury/illness/exacerbation are also affecting patient's functional outcome.   REHAB POTENTIAL: Good  CLINICAL DECISION MAKING: Stable/uncomplicated  EVALUATION COMPLEXITY: Low   GOALS: Goals reviewed with patient? Yes  SHORT TERM GOALS: Target date: 03/04/2024  Pt will be independent with HEP.  Baseline: Goal status: INITIAL  2.  Pt will be independent with use of squatty potty, relaxed toileting mechanics, and improved bowel movement techniques in order to increase ease of bowel movements and complete evacuation.  Baseline:  Goal status: INITIAL  3.  Pt will be independent with the knack, urge suppression technique, and double voiding in order to improve bladder habits and decrease urinary incontinence.  Baseline:  Goal status: INITIAL  LONG TERM GOALS: Target date: 08/05/2024  Pt will be independent with advanced HEP.  Baseline:  Goal status: INITIAL  2.  Pt to demonstrate improved coordination of pelvic floor and breathing mechanics with 10# squat with appropriate synergistic patterns to decrease pain and leakage at least 75% of the time for improved ability to complete a 30 minute workout with strain at pelvic floor and symptoms.   Baseline:  Goal status: INITIAL  3.  Pt will demonstrate 3/5 pelvic floor muscle strength with appropriate coordination in order to decrease urinary incontinence, urgency and frequency.    Baseline:  Goal status: INITIAL  PLAN:  PT FREQUENCY: 1-2x/week  PT DURATION: 6 months  PLANNED INTERVENTIONS: 97110-Therapeutic exercises, 97530- Therapeutic activity, 97112- Neuromuscular re-education, 97535- Self Care, [id number]- Manual therapy, Patient/Family education, Taping, Joint mobilization, Spinal mobilization, Scar mobilization, Cryotherapy, Moist heat, and Biofeedback  PLAN FOR NEXT SESSION: continued pelvic floor AROM in seated, introduce toileting mechanics and urge/knack drill, hip/glute/core strengthening, stretching to back and hips   Celena JAYSON Domino, PT 03/01/2024, 1:26 PM [facility] [address], [address] [place], [place] [address] Phone # [phone] Fax [phone]

## 2024-04-20 ENCOUNTER — Ambulatory Visit: Admitting: Physical Therapy
